# Patient Record
Sex: Male | Born: 1950 | Race: Black or African American | Hispanic: No | Marital: Single | Smoking: Former smoker
Health system: Southern US, Community
[De-identification: ages and names within clinical notes are randomized; demographics above are authoritative.]

## PROBLEM LIST (undated history)

## (undated) DIAGNOSIS — I639 Cerebral infarction, unspecified: Secondary | ICD-10-CM

## (undated) DIAGNOSIS — J449 Chronic obstructive pulmonary disease, unspecified: Secondary | ICD-10-CM

## (undated) DIAGNOSIS — I7090 Unspecified atherosclerosis: Secondary | ICD-10-CM

## (undated) DIAGNOSIS — G8190 Hemiplegia, unspecified affecting unspecified side: Secondary | ICD-10-CM

## (undated) DIAGNOSIS — J189 Pneumonia, unspecified organism: Secondary | ICD-10-CM

## (undated) HISTORY — PX: OTHER SURGICAL HISTORY: SHX169

## (undated) HISTORY — DX: Hemiplegia, unspecified affecting unspecified side: G81.90

## (undated) HISTORY — DX: Unspecified atherosclerosis: I70.90

## (undated) HISTORY — PX: BACK SURGERY: SHX140

## (undated) HISTORY — PX: INCISION AND DRAINAGE: SHX5863

## (undated) HISTORY — DX: Pneumonia, unspecified organism: J18.9

## (undated) HISTORY — DX: Chronic obstructive pulmonary disease, unspecified: J44.9

---

## 2004-02-06 ENCOUNTER — Encounter
Admission: RE | Admit: 2004-02-06 | Discharge: 2004-02-06 | Payer: Self-pay | Admitting: Physical Medicine and Rehabilitation

## 2004-03-25 ENCOUNTER — Ambulatory Visit (HOSPITAL_COMMUNITY): Admission: RE | Admit: 2004-03-25 | Discharge: 2004-03-25 | Payer: Self-pay | Admitting: Vascular Surgery

## 2004-04-12 ENCOUNTER — Inpatient Hospital Stay (HOSPITAL_COMMUNITY): Admission: RE | Admit: 2004-04-12 | Discharge: 2004-04-13 | Payer: Self-pay | Admitting: Vascular Surgery

## 2009-03-25 DIAGNOSIS — I639 Cerebral infarction, unspecified: Secondary | ICD-10-CM

## 2009-03-25 HISTORY — DX: Cerebral infarction, unspecified: I63.9

## 2012-08-30 ENCOUNTER — Encounter (HOSPITAL_COMMUNITY): Payer: Self-pay | Admitting: Emergency Medicine

## 2012-08-30 ENCOUNTER — Emergency Department (HOSPITAL_COMMUNITY)
Admission: EM | Admit: 2012-08-30 | Discharge: 2012-08-30 | Disposition: A | Discharge status: Home / Self Care | Payer: Self-pay | Attending: Emergency Medicine | Admitting: Emergency Medicine

## 2012-08-30 DIAGNOSIS — R609 Edema, unspecified: Secondary | ICD-10-CM | POA: Insufficient documentation

## 2012-08-30 DIAGNOSIS — R600 Localized edema: Secondary | ICD-10-CM

## 2012-08-30 LAB — CBC WITH DIFFERENTIAL/PLATELET
Basophils Absolute: 0 10*3/uL (ref 0.0–0.1)
Basophils Relative: 0 % (ref 0–1)
Eosinophils Absolute: 0.1 10*3/uL (ref 0.0–0.7)
Eosinophils Relative: 1 % (ref 0–5)
HCT: 38.3 % — ABNORMAL LOW (ref 39.0–52.0)
Hemoglobin: 13.2 g/dL (ref 13.0–17.0)
Lymphocytes Relative: 27 % (ref 12–46)
Lymphs Abs: 1.7 10*3/uL (ref 0.7–4.0)
MCH: 32 pg (ref 26.0–34.0)
MCHC: 34.5 g/dL (ref 30.0–36.0)
MCV: 93 fL (ref 78.0–100.0)
Monocytes Absolute: 0.5 10*3/uL (ref 0.1–1.0)
Monocytes Relative: 8 % (ref 3–12)
Neutro Abs: 3.9 10*3/uL (ref 1.7–7.7)
Neutrophils Relative %: 64 % (ref 43–77)
Platelets: 143 10*3/uL — ABNORMAL LOW (ref 150–400)
RBC: 4.12 MIL/uL — ABNORMAL LOW (ref 4.22–5.81)
RDW: 12.4 % (ref 11.5–15.5)
WBC: 6.1 10*3/uL (ref 4.0–10.5)

## 2012-08-30 LAB — COMPREHENSIVE METABOLIC PANEL
ALT: 12 U/L (ref 0–53)
AST: 24 U/L (ref 0–37)
Albumin: 3.9 g/dL (ref 3.5–5.2)
Alkaline Phosphatase: 151 U/L — ABNORMAL HIGH (ref 39–117)
BUN: 19 mg/dL (ref 6–23)
CO2: 27 mEq/L (ref 19–32)
Calcium: 10.5 mg/dL (ref 8.4–10.5)
Chloride: 100 mEq/L (ref 96–112)
Creatinine, Ser: 0.95 mg/dL (ref 0.50–1.35)
GFR calc Af Amer: >90 mL/min (ref >90)
GFR calc non Af Amer: 89 mL/min — ABNORMAL LOW (ref >90)
Glucose, Bld: 96 mg/dL (ref 70–99)
Potassium: 4.1 mEq/L (ref 3.5–5.1)
Sodium: 138 mEq/L (ref 135–145)
Total Bilirubin: 0.5 mg/dL (ref 0.3–1.2)
Total Protein: 7.9 g/dL (ref 6.0–8.3)

## 2012-08-30 MED ORDER — CEPHALEXIN 500 MG PO CAPS: 500.0000 mg | ORAL_CAPSULE | Freq: Four times a day (QID) | ORAL | Status: DC

## 2012-08-30 MED ORDER — ENOXAPARIN SODIUM 60 MG/0.6ML ~~LOC~~ SOLN
60.0000 mg | Freq: Once | SUBCUTANEOUS | Status: AC
  Administered 2012-08-30: 60 mg via SUBCUTANEOUS
  Filled 2012-08-30: qty 0.6

## 2012-08-30 MED ORDER — CEPHALEXIN 250 MG PO CAPS
500.0000 mg | ORAL_CAPSULE | Freq: Once | ORAL | Status: AC
  Administered 2012-08-30: 500 mg via ORAL
  Filled 2012-08-30: qty 2

## 2012-08-30 NOTE — ED Notes (Signed)
Pt states he has had pain in his lower leg for the past five months, pt states he has pain into his toes, but that it may be due to his ingrown toe nails. Pt states he has falling a few times in the past few months due to weakness.

## 2012-08-30 NOTE — ED Provider Notes (Signed)
History     CSN: [id number]  Arrival date & time 08/30/12  1724   First MD Initiated Contact with Patient 08/30/12 1850      Chief Complaint  Patient presents with  . Leg Pain    (Consider location/radiation/quality/duration/timing/severity/associated sxs/prior treatment) HPI  Benjamin [name] is a 62 y.o. male sent by his primary care physician left lower extremity edema, erythema and pain. He is to the last 5 months. There is a stent placed for bypass 2 unknown extremity several years ago and his primary care physician is concerned that there may be a clot. Patient is pain has been worsening over the last 5 months, he has no prior history of DVT or PE. He states that sometimes he stumbled secondary to the pain felt. He is not sure whether the pain is coming from the calf ankle or toe where he has multiple ingrown toenails. He denies any chest pain, shortness of breath, abdominal pain, nausea vomiting, fever, change in bowel or bladder habits.  History reviewed. No pertinent past medical history.  Past Surgical History  Procedure Laterality Date  . Other surgical history      Stent, unknown    No family history on file.  History  Substance Use Topics  . Smoking status: Not on file  . Smokeless tobacco: Not on file  . Alcohol Use: Not on file      Review of Systems  Constitutional: Negative for fever.  Respiratory: Negative for shortness of breath.   Cardiovascular: Negative for chest pain.  Gastrointestinal: Negative for nausea, vomiting, abdominal pain and diarrhea.  Musculoskeletal: Positive for arthralgias.  All other systems reviewed and are negative.    Allergies  Review of patient's allergies indicates no known allergies.  Home Medications  No current outpatient prescriptions on file.  BP 136/74  Pulse 66  Temp(Src) 97.9 F (36.6 C) (Oral)  Resp 18  SpO2 100%  Physical Exam  Nursing note and vitals reviewed. Constitutional: He is oriented to person,  place, and time. He appears well-developed and well-nourished. No distress.  HENT:  Head: Normocephalic and atraumatic.  Mouth/Throat: Oropharynx is clear and moist.  Eyes: Conjunctivae and EOM are normal. Pupils are equal, round, and reactive to light.  Neck: Normal range of motion.  Cardiovascular: Normal rate, regular rhythm and intact distal pulses.   Pulmonary/Chest: Effort normal and breath sounds normal. No stridor. No respiratory distress. He has no wheezes. He has no rales. He exhibits no tenderness.  Abdominal: Soft. Bowel sounds are normal. He exhibits no distension and no mass. There is no tenderness. There is no rebound and no guarding.  Musculoskeletal: Normal range of motion.  Neurological: He is alert and oriented to person, place, and time.  Skin:  1+ pitting edema to left lower extremity up to the distal one third of the shin. There is a warmth, erythema as well. There is no calf asymmetry, superficial collaterals were palpable cords. Homans sign is positive on the left side.  Psychiatric: He has a normal mood and affect.    ED Course  Procedures (including critical care time)  Labs Reviewed  CBC WITH DIFFERENTIAL - Abnormal; Notable for the following:    RBC 4.12 (*)    HCT 38.3 (*)    Platelets 143 (*)    All other components within normal limits  COMPREHENSIVE METABOLIC PANEL - Abnormal; Notable for the following:    Alkaline Phosphatase 151 (*)    GFR calc non Af Amer 89 (*)  All other components within normal limits   No results found.   1. Edema of left lower extremity       MDM   Benjamin [name] is a 62 y.o. male left lower extremity cellulitis versus DVT. Venous duplex was ordered but cannot be completed as ultrasound is no longer in house. I will give him Lovenox and advised him to return in the a.m. to the vascular lab for completion of imaging. I have explained to him that I will also treat him for a cellulitis that he is to continue taking the  antibiotics if the imaging comes up negative for clot. He is instructed to return to the emergency room immediately for any shortness of breath, chest pain, hemoptysis.   Filed Vitals:   08/30/12 1749 08/30/12 1945 08/30/12 2015 08/30/12 2022  BP: 143/88 136/74 157/93   Pulse: 81 66 98   Temp: 97.9 F (36.6 C)     TempSrc: Oral     Resp: 18     Weight:    140 lb (63.504 kg)  SpO2: 95% 100% 94%      VSS and patient is appropriate for, and amenable to, discharge at this time. Pt verbalized understanding and agrees with care plan. Outpatient follow-up and return precautions given.    Discharge Medication List as of 08/30/2012  8:24 PM    START taking these medications   Details  cephALEXin (KEFLEX) 500 MG capsule Take 1 capsule (500 mg total) by mouth 4 (four) times daily., Starting 08/30/2012, Until Discontinued, [employer], PA-C 08/31/12 [phone]

## 2012-08-30 NOTE — ED Notes (Signed)
Pt reporting had a stent placed in his upper leg some time ago. Stent was to increase blood flow. Called PCP told to come here, concerned for blood clot. Pt reports left calf pain  Since early this week. Reports his feet are peeling more than normal.

## 2012-08-31 ENCOUNTER — Ambulatory Visit (HOSPITAL_COMMUNITY)
Admission: RE | Admit: 2012-08-31 | Discharge: 2012-08-31 | Disposition: A | Discharge status: Home / Self Care | Payer: Self-pay | Source: Ambulatory Visit | Attending: Emergency Medicine | Admitting: Emergency Medicine

## 2012-08-31 DIAGNOSIS — M7989 Other specified soft tissue disorders: Secondary | ICD-10-CM

## 2012-08-31 NOTE — Progress Notes (Signed)
VASCULAR LAB PRELIMINARY  PRELIMINARY  PRELIMINARY  PRELIMINARY  Left lower extremity venous duplex completed.    Preliminary report:  Left:  No evidence of DVT, superficial thrombosis, or Baker's cyst.  Zondra Lawlor, RVT 08/31/2012, 10:03 AM

## 2012-08-31 NOTE — ED Provider Notes (Signed)
  Medical screening examination/treatment/procedure(s) were performed by non-physician practitioner and as supervising physician I was immediately available for consultation/collaboration.    Gerhard Munch, MD 08/31/12 2248

## 2013-02-11 ENCOUNTER — Encounter (HOSPITAL_BASED_OUTPATIENT_CLINIC_OR_DEPARTMENT_OTHER): Payer: Medicaid Other | Attending: Plastic Surgery

## 2013-02-11 DIAGNOSIS — I890 Lymphedema, not elsewhere classified: Secondary | ICD-10-CM | POA: Insufficient documentation

## 2013-02-11 DIAGNOSIS — M7989 Other specified soft tissue disorders: Secondary | ICD-10-CM | POA: Insufficient documentation

## 2013-02-12 NOTE — Progress Notes (Signed)
[facility]  NAME:  Benjamin [name], Benjamin [name]                ACCOUNT NO.:  [id number]  MEDICAL RECORD NO.:  [id number]      DATE OF BIRTH:  23-Jan-1951  PHYSICIAN:  Wayland Denis, DO       VISIT DATE:  02/11/2013                                  OFFICE VISIT   The patient is a 62 year old gentleman who is here for evaluation of his left lower extremity.  He has a history of lymphedema.  He did go to the vascular lab and there was no evidence of deep vein thrombosis, and he did have compressibility of his veins.  He has not been elevating his legs and just using a pair of socks.  He does not have any allergies. He is on a fluid pill, he is not sure what kind and vitamin D.  PAST MEDICAL HISTORY:  Positive for possible cerebrovascular accident, hypertension, depression, peripheral vascular disease, and vitamin D deficiency, he has had a left leg peripheral stent.  REVIEW OF SYSTEMS:  Otherwise negative.  SOCIAL HISTORY:  He lives at home.  PHYSICAL EXAMINATION:  GENERAL:  On exam, he is alert, oriented, cooperative, not in any acute distress.  He does have family with him. EYES:  His pupils are equal. NECK:  He does not have any cervical lymphadenopathy. LUNGS:  His breathing is unlabored. ABDOMEN:  Soft and nontender.  He is not obese.  His pulse is regular. EXTREMITIES:  He does have some swelling and lymphedema in the bilateral lower extremities.  No real open wounds, just some scabbed areas.  His pulses are strong.  He does have varicose veins.  Recommend elevation, multivitamin, vitamin C, zinc, protein, check pre- albumin, and Unna boot.  I have expressed the importance of elevation to him, and we will see him back in followup.     Wayland Denis, DO     CS/MEDQ  D:  02/11/2013  T:  02/12/2013  Job:  [id number]

## 2013-02-16 ENCOUNTER — Emergency Department (HOSPITAL_COMMUNITY): Payer: Medicaid Other

## 2013-02-16 ENCOUNTER — Inpatient Hospital Stay (HOSPITAL_COMMUNITY)
Admission: EM | Admit: 2013-02-16 | Discharge: 2013-02-20 | DRG: 065 | Disposition: A | Discharge status: Skilled Nursing Facility | Payer: Medicaid Other | Attending: Internal Medicine | Admitting: Internal Medicine

## 2013-02-16 ENCOUNTER — Encounter (HOSPITAL_COMMUNITY): Payer: Self-pay | Admitting: Emergency Medicine

## 2013-02-16 DIAGNOSIS — R531 Weakness: Secondary | ICD-10-CM | POA: Diagnosis present

## 2013-02-16 DIAGNOSIS — Y92009 Unspecified place in unspecified non-institutional (private) residence as the place of occurrence of the external cause: Secondary | ICD-10-CM

## 2013-02-16 DIAGNOSIS — N390 Urinary tract infection, site not specified: Secondary | ICD-10-CM | POA: Diagnosis present

## 2013-02-16 DIAGNOSIS — I6350 Cerebral infarction due to unspecified occlusion or stenosis of unspecified cerebral artery: Principal | ICD-10-CM | POA: Diagnosis present

## 2013-02-16 DIAGNOSIS — L723 Sebaceous cyst: Secondary | ICD-10-CM | POA: Diagnosis present

## 2013-02-16 DIAGNOSIS — I10 Essential (primary) hypertension: Secondary | ICD-10-CM | POA: Diagnosis present

## 2013-02-16 DIAGNOSIS — W19XXXA Unspecified fall, initial encounter: Secondary | ICD-10-CM | POA: Clinically undetermined

## 2013-02-16 DIAGNOSIS — I639 Cerebral infarction, unspecified: Secondary | ICD-10-CM

## 2013-02-16 DIAGNOSIS — Z8673 Personal history of transient ischemic attack (TIA), and cerebral infarction without residual deficits: Secondary | ICD-10-CM

## 2013-02-16 DIAGNOSIS — D72829 Elevated white blood cell count, unspecified: Secondary | ICD-10-CM | POA: Diagnosis present

## 2013-02-16 DIAGNOSIS — E785 Hyperlipidemia, unspecified: Secondary | ICD-10-CM | POA: Diagnosis present

## 2013-02-16 DIAGNOSIS — L02219 Cutaneous abscess of trunk, unspecified: Secondary | ICD-10-CM | POA: Diagnosis present

## 2013-02-16 DIAGNOSIS — F0390 Unspecified dementia without behavioral disturbance: Secondary | ICD-10-CM | POA: Diagnosis present

## 2013-02-16 DIAGNOSIS — IMO0002 Reserved for concepts with insufficient information to code with codable children: Secondary | ICD-10-CM | POA: Diagnosis present

## 2013-02-16 DIAGNOSIS — R627 Adult failure to thrive: Secondary | ICD-10-CM | POA: Diagnosis present

## 2013-02-16 DIAGNOSIS — E876 Hypokalemia: Secondary | ICD-10-CM | POA: Diagnosis present

## 2013-02-16 DIAGNOSIS — Z23 Encounter for immunization: Secondary | ICD-10-CM

## 2013-02-16 DIAGNOSIS — R29898 Other symptoms and signs involving the musculoskeletal system: Secondary | ICD-10-CM | POA: Diagnosis present

## 2013-02-16 DIAGNOSIS — E538 Deficiency of other specified B group vitamins: Secondary | ICD-10-CM | POA: Diagnosis present

## 2013-02-16 DIAGNOSIS — R5381 Other malaise: Secondary | ICD-10-CM

## 2013-02-16 HISTORY — DX: Cerebral infarction, unspecified: I63.9

## 2013-02-16 LAB — URINALYSIS, ROUTINE W REFLEX MICROSCOPIC
Nitrite: NEGATIVE
Protein, ur: >300 mg/dL — AB
Specific Gravity, Urine: 1.031 — ABNORMAL HIGH (ref 1.005–1.030)
Urobilinogen, UA: 1 mg/dL (ref 0.0–1.0)

## 2013-02-16 LAB — DIFFERENTIAL
Basophils Absolute: 0 10*3/uL (ref 0.0–0.1)
Basophils Relative: 0 % (ref 0–1)
Eosinophils Absolute: 0 10*3/uL (ref 0.0–0.7)
Eosinophils Relative: 0 % (ref 0–5)
Lymphocytes Relative: 4 % — ABNORMAL LOW (ref 12–46)
Lymphs Abs: 0.8 10*3/uL (ref 0.7–4.0)
Monocytes Absolute: 1.3 10*3/uL — ABNORMAL HIGH (ref 0.1–1.0)
Monocytes Relative: 6 % (ref 3–12)
Neutro Abs: 19.9 10*3/uL — ABNORMAL HIGH (ref 1.7–7.7)
Neutrophils Relative %: 91 % — ABNORMAL HIGH (ref 43–77)

## 2013-02-16 LAB — POCT I-STAT, CHEM 8
Chloride: 103 mEq/L (ref 96–112)
Creatinine, Ser: 1.2 mg/dL (ref 0.50–1.35)
HCT: 40 % (ref 39.0–52.0)
Hemoglobin: 13.6 g/dL (ref 13.0–17.0)
Potassium: 3.8 mEq/L (ref 3.5–5.1)
Sodium: 141 mEq/L (ref 135–145)
TCO2: 22 mmol/L (ref 0–100)

## 2013-02-16 LAB — CBC
HCT: 36.3 % — ABNORMAL LOW (ref 39.0–52.0)
Hemoglobin: 12.3 g/dL — ABNORMAL LOW (ref 13.0–17.0)
MCH: 30.3 pg (ref 26.0–34.0)
MCHC: 33.9 g/dL (ref 30.0–36.0)
MCV: 89.4 fL (ref 78.0–100.0)
Platelets: 148 10*3/uL — ABNORMAL LOW (ref 150–400)
RBC: 4.06 MIL/uL — ABNORMAL LOW (ref 4.22–5.81)
RDW: 12.6 % (ref 11.5–15.5)
WBC: 22 10*3/uL — ABNORMAL HIGH (ref 4.0–10.5)

## 2013-02-16 LAB — RAPID URINE DRUG SCREEN, HOSP PERFORMED
Amphetamines: NOT DETECTED
Benzodiazepines: NOT DETECTED
Cocaine: NOT DETECTED
Opiates: NOT DETECTED
Tetrahydrocannabinol: NOT DETECTED

## 2013-02-16 LAB — COMPREHENSIVE METABOLIC PANEL
ALT: 74 U/L — ABNORMAL HIGH (ref 0–53)
AST: 226 U/L — ABNORMAL HIGH (ref 0–37)
Albumin: 2.9 g/dL — ABNORMAL LOW (ref 3.5–5.2)
Alkaline Phosphatase: 171 U/L — ABNORMAL HIGH (ref 39–117)
BUN: 30 mg/dL — ABNORMAL HIGH (ref 6–23)
CO2: 25 mEq/L (ref 19–32)
Calcium: 9.2 mg/dL (ref 8.4–10.5)
Chloride: 98 mEq/L (ref 96–112)
Creatinine, Ser: 0.96 mg/dL (ref 0.50–1.35)
GFR calc Af Amer: >90 mL/min (ref >90)
GFR calc non Af Amer: 88 mL/min — ABNORMAL LOW (ref >90)
Glucose, Bld: 93 mg/dL (ref 70–99)
Potassium: 3.8 mEq/L (ref 3.5–5.1)
Sodium: 136 mEq/L (ref 135–145)
Total Bilirubin: 0.9 mg/dL (ref 0.3–1.2)
Total Protein: 7.2 g/dL (ref 6.0–8.3)

## 2013-02-16 LAB — URINE MICROSCOPIC-ADD ON

## 2013-02-16 LAB — TROPONIN I: Troponin I: <0.3 ng/mL (ref <0.30)

## 2013-02-16 LAB — PROTIME-INR
INR: 1.12 (ref 0.00–1.49)
Prothrombin Time: 14.2 seconds (ref 11.6–15.2)

## 2013-02-16 LAB — POCT I-STAT TROPONIN I: Troponin i, poc: 0.02 ng/mL (ref 0.00–0.08)

## 2013-02-16 LAB — APTT: aPTT: 29 seconds (ref 24–37)

## 2013-02-16 LAB — GLUCOSE, CAPILLARY: Glucose-Capillary: 92 mg/dL (ref 70–99)

## 2013-02-16 LAB — ETHANOL: Alcohol, Ethyl (B): <11 mg/dL (ref 0–11)

## 2013-02-16 MED ORDER — ASPIRIN 325 MG PO TABS
325.0000 mg | ORAL_TABLET | Freq: Every day | ORAL | Status: DC
  Administered 2013-02-17 – 2013-02-20 (×5): 325 mg via ORAL
  Filled 2013-02-16 (×5): qty 1

## 2013-02-16 NOTE — ED Provider Notes (Signed)
Medical screening examination/treatment/procedure(s) were performed by non-physician practitioner and as supervising physician I was immediately available for consultation/collaboration.     Gwyneth Sprout, MD 02/16/13 2240

## 2013-02-16 NOTE — ED Notes (Signed)
Sister is at the bedside stated that pt has been weak on lt side and that she thinks he had a stroke many months ago but he refuse to see a MD because he had no insurance she denies any other deceit only lt side weakness. Pt was seen at wound clinic for lt leg blister on Monday

## 2013-02-16 NOTE — ED Provider Notes (Signed)
CSN: [id number]     Arrival date & time 02/16/13  1936 History   First MD Initiated Contact with Patient 02/16/13 1947     Chief Complaint  Patient presents with  . Weakness   (Consider location/radiation/quality/duration/timing/severity/associated sxs/prior Treatment) HPI Comments: Patient presents to the ED with a chief complaint of left leg weakness.  He states that the symptoms started 2 hours ago.  He was watching a football game, and went to stand up, but could not move his left leg.  He has a history of stroke in 2010, and still has pressured speech from this.  He is able to move his leg in the ED.  Additionally, he was recently seen by wound care for a blister on his leg.  The blister seems to have healed well.  He denies chest pain, SOB, n/v/d.  He has had some cough, and smells of urine.  The history is provided by the patient. No language interpreter was used.    Past Medical History  Diagnosis Date  . Stroke    Past Surgical History  Procedure Laterality Date  . Other surgical history      Stent, unknown   No family history on file. History  Substance Use Topics  . Smoking status: Not on file  . Smokeless tobacco: Not on file  . Alcohol Use: No    Review of Systems  All other systems reviewed and are negative.    Allergies  Review of patient's allergies indicates no known allergies.  Home Medications   Current Outpatient Rx  Name  Route  Sig  Dispense  Refill  . cephALEXin (KEFLEX) 500 MG capsule   Oral   Take 1 capsule (500 mg total) by mouth 4 (four) times daily.   20 capsule   0    BP 147/79  Pulse 73  Temp(Src) 98.1 F (36.7 C) (Oral)  Resp 16  SpO2 97% Physical Exam  Nursing note and vitals reviewed. Constitutional: He is oriented to person, place, and time. He appears well-developed and well-nourished.  HENT:  Head: Normocephalic and atraumatic.  Nose: Nose normal.  Mouth/Throat: Oropharynx is clear and moist. No oropharyngeal exudate.   Abrasion on left forehead  Eyes: Conjunctivae and EOM are normal. Pupils are equal, round, and reactive to light. Right eye exhibits no discharge. Left eye exhibits no discharge. No scleral icterus.  Neck: Normal range of motion. Neck supple. No JVD present.  Cardiovascular: Normal rate, regular rhythm, normal heart sounds and intact distal pulses.  Exam reveals no gallop and no friction rub.   No murmur heard. Pulmonary/Chest: Effort normal and breath sounds normal. No respiratory distress. He has no wheezes. He has no rales. He exhibits no tenderness.  Abdominal: Soft. Bowel sounds are normal. He exhibits no distension and no mass. There is no tenderness. There is no rebound and no guarding.  Musculoskeletal: Normal range of motion. He exhibits no edema and no tenderness.  Neurological: He is alert and oriented to person, place, and time. He has normal reflexes.  CN 3-12 intact  Skin: Skin is warm and dry.  Nodule on back between shoulder blades, non-tender, no evidence of infection   Psychiatric: He has a normal mood and affect. His behavior is normal. Judgment and thought content normal.    ED Course  Procedures (including critical care time) Labs Review Labs Reviewed  ETHANOL  PROTIME-INR  APTT  CBC  DIFFERENTIAL  COMPREHENSIVE METABOLIC PANEL  TROPONIN I  URINE RAPID DRUG SCREEN (HOSP  PERFORMED)  URINALYSIS, ROUTINE W REFLEX MICROSCOPIC   Results for orders placed during the hospital encounter of 02/16/13  ETHANOL      Result Value Range   Alcohol, Ethyl (B) <11  0 - 11 mg/dL  PROTIME-INR      Result Value Range   Prothrombin Time 14.2  11.6 - 15.2 seconds   INR 1.12  0.00 - 1.49  APTT      Result Value Range   aPTT 29  24 - 37 seconds  CBC      Result Value Range   WBC 22.0 (*) 4.0 - 10.5 K/uL   RBC 4.06 (*) 4.22 - 5.81 MIL/uL   Hemoglobin 12.3 (*) 13.0 - 17.0 g/dL   HCT 47.8 (*) 29.5 - 62.1 %   MCV 89.4  78.0 - 100.0 fL   MCH 30.3  26.0 - 34.0 pg   MCHC 33.9   30.0 - 36.0 g/dL   RDW 30.8  65.7 - 84.6 %   Platelets 148 (*) 150 - 400 K/uL  DIFFERENTIAL      Result Value Range   Neutrophils Relative % 91 (*) 43 - 77 %   Neutro Abs 19.9 (*) 1.7 - 7.7 K/uL   Lymphocytes Relative 4 (*) 12 - 46 %   Lymphs Abs 0.8  0.7 - 4.0 K/uL   Monocytes Relative 6  3 - 12 %   Monocytes Absolute 1.3 (*) 0.1 - 1.0 K/uL   Eosinophils Relative 0  0 - 5 %   Eosinophils Absolute 0.0  0.0 - 0.7 K/uL   Basophils Relative 0  0 - 1 %   Basophils Absolute 0.0  0.0 - 0.1 K/uL  COMPREHENSIVE METABOLIC PANEL      Result Value Range   Sodium 136  135 - 145 mEq/L   Potassium 3.8  3.5 - 5.1 mEq/L   Chloride 98  96 - 112 mEq/L   CO2 25  19 - 32 mEq/L   Glucose, Bld 93  70 - 99 mg/dL   BUN 30 (*) 6 - 23 mg/dL   Creatinine, Ser 9.62  0.50 - 1.35 mg/dL   Calcium 9.2  8.4 - 95.2 mg/dL   Total Protein 7.2  6.0 - 8.3 g/dL   Albumin 2.9 (*) 3.5 - 5.2 g/dL   AST 841 (*) 0 - 37 U/L   ALT 74 (*) 0 - 53 U/L   Alkaline Phosphatase 171 (*) 39 - 117 U/L   Total Bilirubin 0.9  0.3 - 1.2 mg/dL   GFR calc non Af Amer 88 (*) >90 mL/min   GFR calc Af Amer >90  >90 mL/min  TROPONIN I      Result Value Range   Troponin I <0.30  <0.30 ng/mL  GLUCOSE, CAPILLARY      Result Value Range   Glucose-Capillary 92  70 - 99 mg/dL  POCT I-STAT, CHEM 8      Result Value Range   Sodium 141  135 - 145 mEq/L   Potassium 3.8  3.5 - 5.1 mEq/L   Chloride 103  96 - 112 mEq/L   BUN 30 (*) 6 - 23 mg/dL   Creatinine, Ser 3.24  0.50 - 1.35 mg/dL   Glucose, Bld 92  70 - 99 mg/dL   Calcium, Ion 4.01  0.27 - 1.30 mmol/L   TCO2 22  0 - 100 mmol/L   Hemoglobin 13.6  13.0 - 17.0 g/dL   HCT 25.3  66.4 - 40.3 %  POCT I-STAT TROPONIN I      Result Value Range   Troponin i, poc 0.02  0.00 - 0.08 ng/mL   Comment 3            Dg Chest 2 View  02/16/2013   CLINICAL DATA:  Left-sided weakness.  EXAM: CHEST  2 VIEW  COMPARISON:  None.  FINDINGS: Cardiopericardial silhouette within normal limits. Mediastinal  contours normal. Trachea midline. No airspace disease or effusion. Monitoring leads project over the chest.  IMPRESSION: No active cardiopulmonary disease.   Electronically Signed   By: Andreas Newport M.D.   On: 02/16/2013 21:46   Ct Head Wo Contrast  02/16/2013   CLINICAL DATA:  Weakness. History of stroke.  EXAM: CT HEAD WITHOUT CONTRAST  TECHNIQUE: Contiguous axial images were obtained from the base of the skull through the vertex without intravenous contrast.  COMPARISON:  None.  FINDINGS: No mass lesion, mass effect, midline shift, hydrocephalus, hemorrhage. No acute territorial cortical ischemia/infarct. Atrophy and chronic ischemic white matter disease is present. Small lacunar infarct is present in the anterior limb of the right internal capsule, likely chronic. Calvarium intact. Left maxillary mucosal thickening. Question prior left maxillary antrectomy. Intracranial atherosclerosis.  IMPRESSION: Atrophy and chronic ischemic white matter disease without acute intracranial abnormality.   Electronically Signed   By: Andreas Newport M.D.   On: 02/16/2013 20:45     Imaging Review No results found.  EKG Interpretation     Ventricular Rate:  76 PR Interval:  158 QRS Duration: 122 QT Interval:  434 QTC Calculation: 488 R Axis:   108 Text Interpretation:  Normal sinus rhythm Right bundle branch block Artifact            MDM   1. Weakness     Patient with stroke history.  Reported weakness 2 hours ago.  Now no neurologic deficits.  Patient discussed with Dr. Anitra Lauth, who recommends admission for TIA workup.  WBC is elevated to 22. Will check urine and CXR.       Roxy Horseman, PA-C 02/16/13 2221
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
## 2013-02-16 NOTE — ED Notes (Signed)
Patient transported to CT 
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
## 2013-02-16 NOTE — ED Notes (Signed)
Bed: ZO10 Expected date: 02/16/13 Expected time: 7:26 PM Means of arrival: Ambulance Comments: orthostatic

## 2013-02-17 ENCOUNTER — Inpatient Hospital Stay (HOSPITAL_COMMUNITY): Payer: Medicaid Other

## 2013-02-17 ENCOUNTER — Encounter (HOSPITAL_COMMUNITY): Payer: Self-pay | Admitting: *Deleted

## 2013-02-17 DIAGNOSIS — I6350 Cerebral infarction due to unspecified occlusion or stenosis of unspecified cerebral artery: Principal | ICD-10-CM

## 2013-02-17 DIAGNOSIS — I369 Nonrheumatic tricuspid valve disorder, unspecified: Secondary | ICD-10-CM

## 2013-02-17 DIAGNOSIS — I639 Cerebral infarction, unspecified: Secondary | ICD-10-CM | POA: Diagnosis present

## 2013-02-17 LAB — CBC
HCT: 33.3 % — ABNORMAL LOW (ref 39.0–52.0)
Hemoglobin: 11.3 g/dL — ABNORMAL LOW (ref 13.0–17.0)
MCH: 30.7 pg (ref 26.0–34.0)
MCHC: 33.9 g/dL (ref 30.0–36.0)
MCV: 90.5 fL (ref 78.0–100.0)
Platelets: 147 10*3/uL — ABNORMAL LOW (ref 150–400)
RDW: 12.8 % (ref 11.5–15.5)
WBC: 17.8 10*3/uL — ABNORMAL HIGH (ref 4.0–10.5)

## 2013-02-17 LAB — LIPID PANEL
Cholesterol: 175 mg/dL (ref 0–200)
Triglycerides: 107 mg/dL (ref <150)
VLDL: 21 mg/dL (ref 0–40)

## 2013-02-17 LAB — HEPATIC FUNCTION PANEL
AST: 208 U/L — ABNORMAL HIGH (ref 0–37)
Alkaline Phosphatase: 145 U/L — ABNORMAL HIGH (ref 39–117)
Bilirubin, Direct: 0.2 mg/dL (ref 0.0–0.3)
Indirect Bilirubin: 0.6 mg/dL (ref 0.3–0.9)
Total Bilirubin: 0.8 mg/dL (ref 0.3–1.2)

## 2013-02-17 LAB — HEMOGLOBIN A1C
Hgb A1c MFr Bld: 5.5 %
Mean Plasma Glucose: 111 mg/dL

## 2013-02-17 LAB — HEPATITIS PANEL, ACUTE
HCV Ab: NEGATIVE
Hep A IgM: NONREACTIVE

## 2013-02-17 LAB — CREATININE, SERUM
GFR calc Af Amer: >90 mL/min (ref >90)
GFR calc non Af Amer: >90 mL/min (ref >90)

## 2013-02-17 LAB — GLUCOSE, CAPILLARY: Glucose-Capillary: 127 mg/dL — ABNORMAL HIGH (ref 70–99)

## 2013-02-17 LAB — HIV ANTIBODY (ROUTINE TESTING W REFLEX): HIV: NONREACTIVE

## 2013-02-17 LAB — RPR: RPR Ser Ql: NONREACTIVE

## 2013-02-17 MED ORDER — SODIUM CHLORIDE 0.9 % IV SOLN
INTRAVENOUS | Status: DC
  Administered 2013-02-17: 1000 mL via INTRAVENOUS
  Administered 2013-02-18: 07:00:00 via INTRAVENOUS

## 2013-02-17 MED ORDER — DEXTROSE 5 % IV SOLN
1.0000 g | INTRAVENOUS | Status: DC
  Administered 2013-02-17 – 2013-02-19 (×3): 1 g via INTRAVENOUS
  Filled 2013-02-17 (×3): qty 10

## 2013-02-17 MED ORDER — IOHEXOL 300 MG/ML  SOLN
100.0000 mL | Freq: Once | INTRAMUSCULAR | Status: AC | PRN
  Administered 2013-02-17: 100 mL via INTRAVENOUS

## 2013-02-17 MED ORDER — HEPARIN SODIUM (PORCINE) 5000 UNIT/ML IJ SOLN
5000.0000 [IU] | Freq: Three times a day (TID) | INTRAMUSCULAR | Status: DC
  Administered 2013-02-17 – 2013-02-19 (×7): 5000 [IU] via SUBCUTANEOUS
  Filled 2013-02-17 (×10): qty 1

## 2013-02-17 NOTE — Progress Notes (Addendum)
Patient ID: Benjamin [name]  male  ZOX:[id number]    DOB: [birth year]    DOA: 02/16/2013  PCP: No PCP Per Patient  Addendum:  MRI results reviewed MRI HEAD IMPRESSION  1. Acute nonhemorrhagic infarct involving the right caudate head  measures 14 mm maximally.  2. Punctate diffusion abnormality in the posterior left frontal lobe  may represent a resolving white matter infarct.  3. Diffuse atrophy and extensive white matter disease, suggesting  advanced microvascular ischemic changes.  4. Multiple bilateral remote lacunar infarcts of the basal ganglia  and cerebellum bilaterally.  MRA HEAD IMPRESSION  1. Mild distal small vessel disease.  2. No significant proximal stenosis, aneurysm, or branch vessel  occlusion.  - Neurology consult called, d/w Dr Marland Kitchen M.D. [facility] 02/17/2013, 2:53 PM  Pager: [phone]     Assessment/Plan: Principal Problem: Left-sided weakness superimposed on generalized debility: The patient is a poor historian, states that left sided weakness for last 1 week, has been using a walker for few months, per admitting H&P, sudden onset of aphasia in December 2014 with trouble walking but did not seek medical care. Yesterday was not able to walk and was crawling in the house and his head was scraped on the rug - CVA workup in progress, pending MRI/MRA brain. CT head showed atrophic in chronic ischemic white matter disease without acute intracranial abnormality. Small lacunar infarct in the anterior limb of the right internal capsule likely chronic. - 2-D echo, carotid Dopplers ordered - PT OT evaluation pending - Lipid panel shows cholesterol 145, LDL 103. Will not place on statin due to acute transaminitis  - Hemoglobin A1c pending  -Continue aspirin 325 mg daily - RN swallow screen, start on heart healthy diet    Active Problems: Transaminitis: States that he has quit drinking alcohol - RUQ US shows 2.3 cm x2.6 cm echogenic area in  left lobe of the liver, obtain contrast CT abdomen to rule out any hemangioma versus mass - HIV  hepatitis panel pending    Leukocytosis: Likely due to UTI Urine culture pending, started on Rocephin IV     Failure to thrive in adult, appears to have some dementia as well - Will likely need a skilled nursing facility, await PT evaluation  - B12, folate, RPR, HIV pending   abrasions of the head and forehead  -wound care consult  DVT Prophylaxis: heparin subcutaneous   Code Status:  Disposition:    Subjective: Poor Historian, denies any specific complaints, states left sided weakness his one week ago, however had sudden aphasia and difficulty ambulating since December 2013. Denies any coughing or choking.   Objective: Weight change:  No intake or output data in the 24 hours ending 02/17/13 1020 Blood pressure 158/90, pulse 86, temperature 98.4 F (36.9 C), temperature source Oral, resp. rate 18, height 5\' 9"  (1.753 m), weight 66.3 kg (146 lb 2.6 oz), SpO2 96.00%.  Physical Exam: General: Alert and awake, oriented x3, not in any acute distress. Bandage on head, abrasions on forehead CVS: S1-S2 clear, no murmur rubs or gallops Chest: clear to auscultation bilaterally, no wheezing, rales or rhonchi Abdomen: soft nontender, nondistended, normal bowel sounds  Extremities: no cyanosis, clubbing or edema noted bilaterally Neuro: Cranial nerves II-XII intact, LUE and LLE 4/5, RUE and RLE 5/5  Lab Results: Basic Metabolic Panel:  Recent Labs Lab 02/16/13 2022 02/16/13 2101 02/17/13 0440  NA 136 141  --   K 3.8 3.8  --   CL 98  103  --   CO2 25  --   --   GLUCOSE 93 92  --   BUN 30* 30*  --   CREATININE 0.96 1.20 0.88  CALCIUM 9.2  --   --    Liver Function Tests:  Recent Labs Lab 02/16/13 2022 02/17/13 0440  AST 226* 208*  ALT 74* 69*  ALKPHOS 171* 145*  BILITOT 0.9 0.8  PROT 7.2 6.9  ALBUMIN 2.9* 2.8*   No results found for this basename: LIPASE, AMYLASE,  in  the last 168 hours No results found for this basename: AMMONIA,  in the last 168 hours CBC:  Recent Labs Lab 02/16/13 2022 02/16/13 2101 02/17/13 0440  WBC 22.0*  --  17.8*  NEUTROABS 19.9*  --   --   HGB 12.3* 13.6 11.3*  HCT 36.3* 40.0 33.3*  MCV 89.4  --  90.5  PLT 148*  --  147*   Cardiac Enzymes:  Recent Labs Lab 02/16/13 2022  TROPONINI <0.30   BNP: No components found with this basename: POCBNP,  CBG:  Recent Labs Lab 02/16/13 2103 02/17/13 0753  GLUCAP 92 87     Micro Results: No results found for this or any previous visit (from the past 240 hour(s)).  Studies/Results: Dg Chest 2 View  02/16/2013   CLINICAL DATA:  Left-sided weakness.  EXAM: CHEST  2 VIEW  COMPARISON:  None.  FINDINGS: Cardiopericardial silhouette within normal limits. Mediastinal contours normal. Trachea midline. No airspace disease or effusion. Monitoring leads project over the chest.  IMPRESSION: No active cardiopulmonary disease.   Electronically Signed   By: Andreas Newport M.D.   On: 02/16/2013 21:46   Ct Head Wo Contrast  02/16/2013   CLINICAL DATA:  Weakness. History of stroke.  EXAM: CT HEAD WITHOUT CONTRAST  TECHNIQUE: Contiguous axial images were obtained from the base of the skull through the vertex without intravenous contrast.  COMPARISON:  None.  FINDINGS: No mass lesion, mass effect, midline shift, hydrocephalus, hemorrhage. No acute territorial cortical ischemia/infarct. Atrophy and chronic ischemic white matter disease is present. Small lacunar infarct is present in the anterior limb of the right internal capsule, likely chronic. Calvarium intact. Left maxillary mucosal thickening. Question prior left maxillary antrectomy. Intracranial atherosclerosis.  IMPRESSION: Atrophy and chronic ischemic white matter disease without acute intracranial abnormality.   Electronically Signed   By: Andreas Newport M.D.   On: 02/16/2013 20:45   US Abdomen Complete  02/17/2013   CLINICAL  DATA:  Elevated LFTs  EXAM: ULTRASOUND ABDOMEN COMPLETE  COMPARISON:  None.  FINDINGS: Gallbladder  Gallbladder sludge is noted. No shadowing gallstones. No thickening of gallbladder wall. No sonographic Murphy's sign.  Common bile duct  Diameter: There is CBD dilatation measuring 11 mm in diameter.  Liver  There liver is heterogeneous. There is echogenic area in left hepatic lobe measures 2.6 x 2.3 cm. Statistically this may represent a hemangioma. Confirmation with enhanced CT or MRI could be performed. No intrahepatic biliary ductal dilatation.  IVC  No abnormality visualized.  Pancreas  No focal pancreatic abnormality. Main pancreatic duct measures 2.9 mm in diameter.  Spleen  Size and appearance within normal limits.  Measures 6 cm in length.  Right Kidney  Length: 10.9 cm. Echogenicity within normal limits. No mass or hydronephrosis visualized.  Left Kidney  Length: 10 cm. Echogenicity within normal limits. No mass or hydronephrosis visualized.  Abdominal aorta  No aneurysm visualized.  Measures up to 2.5 cm in diameter.  IMPRESSION: 1.  No shadowing gallstones are noted within gallbladder. Gallbladder sludge is noted. 2. CBD dilatation measuring 1.1 cm in diameter. 3. Heterogeneous liver without biliary ductal dilatation. Question hemangioma in left hepatic lobe measures 2.6 x 2.3 cm. Further evaluation with enhanced CT or MRI could be performed. 4. No hydronephrosis or hydroureter.   Electronically Signed   By: Natasha Mead M.D.   On: 02/17/2013 09:46    Medications: Scheduled Meds: . aspirin  325 mg Oral Daily  . cefTRIAXone (ROCEPHIN)  IV  1 g Intravenous Q24H  . heparin  5,000 Units Subcutaneous Q8H      LOS: 1 day   RAI,RIPUDEEP M.D. [facility] 02/17/2013, 10:20 AM Pager: [phone]  If 7PM-7AM, please contact night-coverage [email or web address] Password [email or web address]

## 2013-02-17 NOTE — Consult Note (Signed)
Referring Physician: Dr Isidoro Donning    Chief Complaint: Stroke  HPI: Benjamin Benjamin [name] is an 62 y.o. male admitted to [facility] yesterday after his sister found him on the floor of his home unable to get up. 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 The patient's sister is in the room today and she gives most of the history. Apparently in 2010 she noted that the patient was slurring his speech. This has occurred intermittently since that time. The patient has also complained of intermittent numbness of his upper extremities at different times. Neurology has been asked to Benjamin [name] the patient and to make recommendations regarding treatment.  Date last known well: Date: 02/14/2013 Time last known well: Unable to determine tPA Given: No: The patient was outside the window for treatment.  Past Medical History  Diagnosis Date  . Stroke     Past Surgical History  Procedure Laterality Date  . Other surgical history      Stent, unknown    Family history: The patient's father died in his 73s following a motor vehicle accident. His mother is living at age 58. She has diabetes, coronary artery disease, thyroid problems, and elevated cholesterol.  Social History:  He quit smoking in June. He had been smoking 1 pack cigarettes per day for approximately 40 years. He also quit drinking alcohol in April of this year. He usually drank approximately two 6 packs of beer per week. He denies drug use. He has never been married. He lives alone. He has never used smokeless tobacco.   Allergies: No Known Allergies  Medications:  Scheduled: . aspirin  325 mg Oral Daily  . cefTRIAXone (ROCEPHIN)  IV  1 g Intravenous Q24H  . heparin  5,000 Units Subcutaneous Q8H    ROS: History obtained from the patient and  sister  General ROS: negative for - chills, fatigue, fever, night sweats, weight gain or weight loss. Positive for staying cold. Psychological ROS: negative for - behavioral disorder, hallucinations, memory difficulties, mood swings or suicidal ideation Ophthalmic ROS: negative for - blurry vision, double vision, eye pain or loss of vision ENT ROS: negative for - epistaxis, nasal discharge, oral lesions, sore throat, tinnitus or vertigo Allergy and Immunology ROS: negative for - hives or itchy/watery eyes Hematological and Lymphatic ROS: negative for - bleeding problems, bruising or swollen lymph nodes Endocrine ROS: negative for - galactorrhea, hair pattern changes, polydipsia/polyuria or temperature intolerance Respiratory ROS: negative for - hemoptysis, shortness of breath or wheezing. Positive for mildly productive cough times several days Cardiovascular ROS: negative for - chest pain, dyspnea on exertion, edema or irregular heartbeat Gastrointestinal ROS: negative for - abdominal pain, diarrhea, hematemesis, nausea/vomiting or stool incontinence Genito-Urinary ROS: negative for - dysuria, hematuria, incontinence.  Positive for urinary frequency. Musculoskeletal ROS: negative for - joint swelling or muscular weakness Neurological ROS: as noted in HPI Dermatological ROS: negative for rash and skin lesion changes   Physical Examination: Blood pressure 158/90, pulse 86, temperature 98.4 F (36.9 C), temperature source Oral, resp. rate 18, height 5\' 9"  (1.753 m), weight 66.3 kg (146 lb 2.6 oz), SpO2 96.00%.  General - thin frail appearing 62 year old male in bed in no acute distress. Heart - Regular rate and rhythm with occasional ectopics Lungs - Clear to auscultation Abdomen - Soft - non tender Extremities -  Distal pulses intact - no edema Skin - Warm and dry  Neurologic Examination:  Mental Status: Alert, oriented, thought content appropriate.  Speech fluent but speaks slowly  without evidence of aphasia.  Able to follow 3 step commands without difficulty. Cranial Nerves: II: Discs not visualized; Visual fields grossly normal, pupils equal, round, reactive to light and accommodation III,IV, VI: ptosis not present, extra-ocular motions intact bilaterally V,VII: smile symmetric, facial light touch sensation normal bilaterally VIII: hearing normal bilaterally IX,X: gag reflex present XI: bilateral shoulder shrug XII: midline tongue extension Motor: Right : Upper extremity   5/5    Left:     Upper extremity   5/5  Lower extremity   5/5     Lower extremity   5/5 Tone and bulk:normal tone throughout; no atrophy noted Sensory: Pinprick and light touch intact throughout, bilaterally Deep Tendon Reflexes: 2+ and symmetric throughout Plantars: Right: downgoing   Left: downgoing Cerebellar: Finger to nose with mild difficulty using his right upper extremity, normal rapid alternating movements and normal heel-to-shin test Gait: Deferred    Laboratory Studies:  Basic Metabolic Panel:  Recent Labs Lab 02/16/13 2022 02/16/13 2101 02/17/13 0440  NA 136 141  --   K 3.8 3.8  --   CL 98 103  --   CO2 25  --   --   GLUCOSE 93 92  --   BUN 30* 30*  --   CREATININE 0.96 1.20 0.88  CALCIUM 9.2  --   --     Liver Function Tests:  Recent Labs Lab 02/16/13 2022 02/17/13 0440  AST 226* 208*  ALT 74* 69*  ALKPHOS 171* 145*  BILITOT 0.9 0.8  PROT 7.2 6.9  ALBUMIN 2.9* 2.8*   No results found for this basename: LIPASE, AMYLASE,  in the last 168 hours No results found for this basename: AMMONIA,  in the last 168 hours  CBC:  Recent Labs Lab 02/16/13 2022 02/16/13 2101 02/17/13 0440  WBC 22.0*  --  17.8*  NEUTROABS 19.9*  --   --   HGB 12.3* 13.6 11.3*  HCT 36.3* 40.0 33.3*  MCV 89.4  --  90.5  PLT 148*  --  147*    Cardiac Enzymes:  Recent Labs Lab 02/16/13 2022  TROPONINI <0.30    BNP: No components found with this basename: POCBNP,    CBG:  Recent Labs Lab 02/16/13 2103 02/17/13 0753  GLUCAP 92 87    Microbiology: No results found for this or any previous visit.  Coagulation Studies:  Recent Labs  02/16/13 2022  LABPROT 14.2  INR 1.12    Urinalysis:   Recent Labs Lab 02/16/13 2207  COLORURINE AMBER*  LABSPEC 1.031*  PHURINE 6.0  GLUCOSEU NEGATIVE  HGBUR LARGE*  BILIRUBINUR LARGE*  KETONESUR 40*  PROTEINUR >300*  UROBILINOGEN 1.0  NITRITE NEGATIVE  LEUKOCYTESUR TRACE*    Lipid Panel:    Component Value Date/Time   CHOL 175 02/17/2013 0440   TRIG 107 02/17/2013 0440   HDL 51 02/17/2013 0440   CHOLHDL 3.4 02/17/2013 0440   VLDL 21 02/17/2013 0440   LDLCALC 103* 02/17/2013 0440    HgbA1C:  Lab Results  Component Value Date   HGBA1C 5.5 02/17/2013    Urine Drug Screen:     Component Value Date/Time   LABOPIA NONE DETECTED 02/16/2013 2207   COCAINSCRNUR NONE DETECTED 02/16/2013 2207   LABBENZ NONE DETECTED 02/16/2013 2207   AMPHETMU NONE DETECTED 02/16/2013 2207   THCU NONE DETECTED  02/16/2013 2207   LABBARB NONE DETECTED 02/16/2013 2207    Alcohol Level:   Recent Labs Lab 02/16/13 2022  ETH <11    Other results: EKG: Sinus rhythm rate 76 beats per minute  Imaging:  Dg Chest 2 View 02/16/2013    No active cardiopulmonary disease.     Ct Head Wo Contrast 02/16/2013     Atrophy and chronic ischemic white matter disease without acute intracranial abnormality.     MRA Head Wo Contrast 02/17/2013    1. Mild distal small vessel disease. 2. No significant proximal stenosis, aneurysm, or branch vessel occlusion.     MRI head without contrast 02/17/2013 1. Acute nonhemorrhagic infarct involving the right caudate head  measures 14 mm maximally.  2. Punctate diffusion abnormality in the posterior left frontal lobe  may represent a resolving white matter infarct.  3. Diffuse atrophy and extensive white matter disease, suggesting  advanced microvascular ischemic  changes.  4. Multiple bilateral remote lacunar infarcts of the basal ganglia  and cerebellum bilaterally.  US Abdomen Complete 02/17/2013    1. No shadowing gallstones are noted within gallbladder. Gallbladder sludge is noted. 2. CBD dilatation measuring 1.1 cm in diameter. 3. Heterogeneous liver without biliary ductal dilatation. Question hemangioma in left hepatic lobe measures 2.6 x 2.3 cm. Further evaluation with enhanced CT or MRI could be performed. 4. No hydronephrosis or hydroureter.     Ct Abd Wo & W Cm 02/17/2013     1. Perceived mass seen on ultrasound performed earlier today represents focal fatty infiltration  2.  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   Benjamin See PA-C [facility] Pager [phone] 02/17/2013, 4:39 PM  Patient seen and examined.  Clinical course and management discussed.  Necessary edits performed.  I agree with the above.  Assessment and plan of care developed and discussed below.    Assessment: 62 y.o. male admitted 02/16/2013 after his sister found him on the floor of his home unable to get up. An MRI reviewed and shows an acute nonhemorrhagic infarct involving the right caudate head and a left parietal subacute infarct. The patient also had multiple bilateral remote lacunar infarcts of the basal ganglia and cerebellum bilaterally. Carotid dopplers are unremarkable.  Echocardiogram shows no intracardiac masses or thrombi with normal EF  LDL is elevated at 103.  A1c 5.5.  Stroke Risk Factors - hyperlipidemia, hypertension and smoking  Plan: 1. PT consult, OT consult, Speech consult 2. Prophylactic therapy-Antiplatelet med: Aspirin - dose 325 mg daily. 3. Risk factor modification.  Statin initiation. 4. Telemetry monitoring 5. Frequent neuro checks  Thana Farr, MD [facility] [phone]  02/17/2013  5:20 PM

## 2013-02-17 NOTE — Progress Notes (Signed)
  Echocardiogram 2D Echocardiogram has been performed.  Benjamin [name] 02/17/2013, 12:20 PM

## 2013-02-17 NOTE — Progress Notes (Signed)
Bilateral carotid artery duplex:  1-39% ICA stenosis.  Vertebral artery flow is antegrade.     

## 2013-02-17 NOTE — ED Notes (Signed)
[facility] here for transportation to [facility]

## 2013-02-17 NOTE — Progress Notes (Signed)
[facility] progress note Chief complaint. Transfer note. History of present illness. This 62 year old male presented to [facility] long emergency room with left leg weakness and failure to thrive. Apparently in December he experienced aphasia and difficulty ambulating but was never seen medically. He is steadily decline to where he is no longer able to ambulate.was felt the patient likely had a CVA in December but never obtained medical care. He was felt to require further stroke workup and arranged for transfer from [facility] long hospital to [facility]. Patient has now arrived in transfer and I'm seeing him to ensure that he remained stable post transfer and that his orders transferred appropriately. Patient has no current complaints. Vital signs. Temperature 98.3, pulse 85, respiration 18, blood pressure 142/76. O2 sats 100%. General appearance. Frail elderly male looks somewhat older than his stated age. He is alert and without distress. Cardiac. Rate and rhythm regular. Lungs.breath sounds clear and equal. Abdomen. Soft with positive bowel sounds. No pain. Neurologic. Cranial nerves 2-12 grossly intact. Strength somewhat weaker on the left. Impression/plan Problem #1.left leg weakness and failure to thrive. Likely patient had a CVA last December but never obtained medical care. Patient for MRI another workup for leukocytosis and elevated liver function tests. Patient clinically stable per bedside exam post transfer. All orders appear to of transferred appropriately.

## 2013-02-17 NOTE — Evaluation (Signed)
Physical Therapy Evaluation Patient Details Name: Benjamin [name] MRN: [id number] DOB: [birth year] Today's Date: 02/17/2013 Time: 4098-1191 PT Time Calculation (min): 23 min  PT Assessment / Plan / Recommendation History of Present Illness  Patient is a 62 yo male admitted with weakness, FTT.  Sister found patient on floor (patient unable to walk) and brought to ED.  Per chart, possible CVA in December 2013 - no medical attention at that time.    Clinical Impression  Patient presents with problems listed below.  Will benefit from acute PT to maximize independence prior to discharge.  Patient lives alone.  Recommend SNF for continued therapy at discharge.    PT Assessment  Patient needs continued PT services    Follow Up Recommendations  SNF    Does the patient have the potential to tolerate intense rehabilitation      Barriers to Discharge Decreased caregiver support Patient lives alone    Equipment Recommendations  None recommended by PT    Recommendations for Other Services     Frequency Min 3X/week    Precautions / Restrictions Precautions Precautions: Fall Precaution Comments: Lt sided weakness Restrictions Weight Bearing Restrictions: No   Pertinent Vitals/Pain       Mobility  Bed Mobility Bed Mobility: Rolling Left;Left Sidelying to Sit;Sitting - Scoot to [place] of Bed;Sit to Supine Rolling Left: 3: Mod assist;With rail Left Sidelying to Sit: 2: Max assist;With rails;HOB elevated Sitting - Scoot to Edge of Bed: 3: Mod assist Sit to Supine: 2: Max assist;HOB elevated Details for Bed Mobility Assistance: Verbal and tactile cues for technique.  Assist to bring RUE to rail.  Assist to bring LE's off of bed and to raise trunk to sitting.  Patient sat EOB x 12 minutes.  Assist to return to supine to lower trunk and bring LE's onto bed. Transfers Transfers: Sit to Stand;Stand to Sit Sit to Stand: 2: Max assist;From elevated surface;With upper extremity assist;From  bed Stand to Sit: 4: Min assist;With upper extremity assist;To bed Details for Transfer Assistance: Verbal cues for hand placement.  Attempted to stand x2 unsuccessfully.  Elevated bed.  Patient stood with assist to rise from bed.  Patient stood x 2 minutes. Ambulation/Gait Ambulation/Gait Assistance: Not tested (comment) Modified Rankin (Stroke Patients Only) Pre-Morbid Rankin Score: Slight disability Modified Rankin: Severe disability    Exercises General Exercises - Lower Extremity Long Arc Quad: AROM;Both;10 reps;Seated Hip ABduction/ADduction: AROM;Both;10 reps;Seated Hip Flexion/Marching: AROM;Both;10 reps;Seated Toe Raises: AROM;Both;10 reps;Seated Heel Raises: AROM;Both;10 reps;Seated   PT Diagnosis: Difficulty walking;Generalized weakness  PT Problem List: Decreased strength;Decreased activity tolerance;Decreased balance;Decreased mobility;Decreased knowledge of use of DME;Decreased safety awareness PT Treatment Interventions: DME instruction;Gait training;Functional mobility training;Therapeutic exercise;Balance training;Patient/family education     PT Goals(Current goals can be found in the care plan section) Acute Rehab PT Goals Patient Stated Goal: To get stronger PT Goal Formulation: With patient Time For Goal Achievement: 03/03/13 Potential to Achieve Goals: Good  Visit Information  Last PT Received On: 02/17/13 Assistance Needed: +2 History of Present Illness: Patient is a 62 yo male admitted with weakness, FTT.  Sister found patient on floor (patient unable to walk) and brought to ED.  Per chart, possible CVA in December 2013 - no medical attention at that time.         Prior Functioning  Home Living Family/patient expects to be discharged to:: Skilled nursing facility Living Arrangements: Alone Home Equipment: Dan Humphreys - 2 wheels;Walker - standard Prior Function Level of Independence: Independent with assistive device(s) Comments:  sister provided  transportation and groceries Communication Communication: No difficulties (Soft voice) Dominant Hand: Right    Cognition  Cognition Arousal/Alertness: Awake/alert Behavior During Therapy: Flat affect Overall Cognitive Status: No family/caregiver present to determine baseline cognitive functioning    Extremity/Trunk Assessment Upper Extremity Assessment Upper Extremity Assessment: Generalized weakness;LUE deficits/detail LUE Deficits / Details: Decreased strength compared to RUE LUE Sensation: decreased light touch Lower Extremity Assessment Lower Extremity Assessment: Generalized weakness;LLE deficits/detail LLE Deficits / Details: Decreased strength compared with RLE.  LLE strength 3+/5 LLE Sensation: decreased light touch Cervical / Trunk Assessment Cervical / Trunk Assessment: Other exceptions Cervical / Trunk Exceptions: Decreased cervical strength.  Unable to lift head from pillow.  Patient reports numbness right side of face.   Balance Balance Balance Assessed: Yes Static Sitting Balance Static Sitting - Balance Support: Bilateral upper extremity supported;Feet supported Static Sitting - Level of Assistance: 5: Stand by assistance Static Sitting - Comment/# of Minutes: 12 minutes.  Able to maintain upright position, however leaning to left.  Cues to move to midline position. Static Standing Balance Static Standing - Balance Support: Bilateral upper extremity supported Static Standing - Level of Assistance: 4: Min assist Static Standing - Comment/# of Minutes: 2 minutes.  Once upright, required min assist for balance with use of RW  End of Session PT - End of Session Equipment Utilized During Treatment: Gait belt Activity Tolerance: Patient limited by fatigue Patient left: in bed;with call bell/phone within reach Nurse Communication: Mobility status  GP     Vena Austria 02/17/2013, 11:29 AM Durenda Hurt. Renaldo Fiddler, [facility] Acute Rehab Services Pager [phone]

## 2013-02-18 DIAGNOSIS — E538 Deficiency of other specified B group vitamins: Secondary | ICD-10-CM | POA: Diagnosis present

## 2013-02-18 DIAGNOSIS — N390 Urinary tract infection, site not specified: Secondary | ICD-10-CM | POA: Diagnosis present

## 2013-02-18 LAB — COMPREHENSIVE METABOLIC PANEL
AST: 213 U/L — ABNORMAL HIGH (ref 0–37)
Albumin: 2.4 g/dL — ABNORMAL LOW (ref 3.5–5.2)
BUN: 14 mg/dL (ref 6–23)
CO2: 27 mEq/L (ref 19–32)
Calcium: 8.6 mg/dL (ref 8.4–10.5)
Chloride: 100 mEq/L (ref 96–112)
Creatinine, Ser: 0.79 mg/dL (ref 0.50–1.35)
GFR calc Af Amer: >90 mL/min (ref >90)
GFR calc non Af Amer: >90 mL/min (ref >90)
Glucose, Bld: 95 mg/dL (ref 70–99)
Total Bilirubin: 1 mg/dL (ref 0.3–1.2)

## 2013-02-18 LAB — URINE CULTURE

## 2013-02-18 LAB — CBC
Hemoglobin: 10.7 g/dL — ABNORMAL LOW (ref 13.0–17.0)
MCV: 90.3 fL (ref 78.0–100.0)
Platelets: 153 10*3/uL (ref 150–400)
RBC: 3.52 MIL/uL — ABNORMAL LOW (ref 4.22–5.81)
RDW: 12.9 % (ref 11.5–15.5)
WBC: 9.5 10*3/uL (ref 4.0–10.5)

## 2013-02-18 MED ORDER — INFLUENZA VAC SPLIT QUAD 0.5 ML IM SUSP
0.5000 mL | INTRAMUSCULAR | Status: AC
  Administered 2013-02-20: 0.5 mL via INTRAMUSCULAR
  Filled 2013-02-18: qty 0.5

## 2013-02-18 MED ORDER — VITAMIN B-12 1000 MCG PO TABS
1000.0000 ug | ORAL_TABLET | Freq: Every day | ORAL | Status: DC
  Administered 2013-02-18 – 2013-02-20 (×3): 1000 ug via ORAL
  Filled 2013-02-18 (×3): qty 1

## 2013-02-18 NOTE — Care Management Note (Unsigned)
    Page 1 of 1   02/18/2013     1:55:25 PM   CARE MANAGEMENT NOTE 02/18/2013  Patient:  Benjamin, [name]   Account Number:  [id number]  Date Initiated:  02/18/2013  Documentation initiated by:  GRAVES-BIGELOW,Tyrece Vanterpool  Subjective/Objective Assessment:   Pt admitted for Left-sided weakness. MRI revealed an acute nonhemorrhagic infarct involving the right caudate head which measured 14 mm maximally.     Action/Plan:   Plan will be for SNF when medically stable for d/c. CSW assisitning with disposition needs. CM will continue to monitor.   Anticipated DC Date:  02/19/2013   Anticipated DC Plan:  SKILLED NURSING FACILITY  In-house referral  Clinical Social Worker      DC Planning Services  CM consult      Choice offered to / List presented to:             Status of service:  In process, will continue to follow Medicare Important Message given?   (If response is "NO", the following Medicare IM given date fields will be blank) Date Medicare IM given:   Date Additional Medicare IM given:    Discharge Disposition:    Per UR Regulation:  Reviewed for med. necessity/level of care/duration of stay  If discussed at Long Length of Stay Meetings, dates discussed:    Comments:

## 2013-02-18 NOTE — Evaluation (Signed)
Occupational Therapy Evaluation Patient Details Name: Benjamin [name] MRN: [id number] DOB: 22-Nov-1950 Today's Date: 02/18/2013 Time: 4098-1191 OT Time Calculation (min): 32 min  OT Assessment / Plan / Recommendation History of present illness Patient is a 62 yo male admitted with weakness, FTT.  Sister found patient on floor (patient unable to walk) and brought to ED.  Per chart, possible CVA in December 2013 - no medical attention at that time.     Clinical Impression   Pt admitted w/ dx as above. He currently presents w/ deficits in ability to perform ADL's (see problem list below) and should benefit from acute OT followed by SNF Rehab to assist in maximizing independence in ADL & functional transfers secondary to living alone.     OT Assessment  Patient needs continued OT Services    Follow Up Recommendations  SNF    Barriers to Discharge Other (comment) (Lives alone)    Equipment Recommendations  None recommended by OT    Recommendations for Other Services    Frequency  Min 2X/week    Precautions / Restrictions Precautions Precautions: Fall Precaution Comments: Lt sided weakness Restrictions Weight Bearing Restrictions: No   Pertinent Vitals/Pain No, denies pain   ADL  Eating/Feeding: Performed;Set up Where Assessed - Eating/Feeding: Bed level Grooming: Performed;Wash/dry hands;Wash/dry face;Min guard;Set up Where Assessed - Grooming: Unsupported sitting Upper Body Bathing: Performed;Chest;Right arm;Left arm;Minimal assistance Where Assessed - Upper Body Bathing: Unsupported sitting Lower Body Bathing: Simulated;Moderate assistance Where Assessed - Lower Body Bathing: Supported sit to stand Upper Body Dressing: Performed;Min guard;Set up Where Assessed - Upper Body Dressing: Unsupported sitting Lower Body Dressing: Performed;Moderate assistance Where Assessed - Lower Body Dressing: Supported sit to stand Toilet Transfer: Simulated;Minimal assistance (Sit to stand  from EOB while RN changed linens) Toilet Transfer Method: Sit to stand;Stand pivot Acupuncturist: Bedside commode Toileting - Clothing Manipulation and Hygiene: Simulated;Moderate assistance Where Assessed - Toileting Clothing Manipulation and Hygiene: Sit to stand from 3-in-1 or toilet Tub/Shower Transfer Method: Not assessed Equipment Used: Gait belt Transfers/Ambulation Related to ADLs: Pt was overall min A sit-stand from EOB w/ bilateral UE assist. Pt stood x2 min and began to feel weak stating "I feel like I'm going to fall" VC/TC's for safety & hand placement ADL Comments: Pt was educated in role of OT and participated in grooming while sitting at EOB. Pt initially min A - min guard w/ verbal & tactile cues to shift weight & trunk for better stability. Pt w/ noted difficulty holding head upright in sitting/standing. Pt able to sit EOB x62min, then sit to stand activity while RN changed sheets on bed. Pt will benefit from cont OT .    OT Diagnosis: Generalized weakness;Cognitive deficits  OT Problem List: Decreased strength;Decreased activity tolerance;Impaired balance (sitting and/or standing);Decreased knowledge of use of DME or AE;Decreased safety awareness;Impaired UE functional use;Decreased knowledge of precautions;Decreased cognition OT Treatment Interventions: Self-care/ADL training;Therapeutic exercise;Neuromuscular education;DME and/or AE instruction;Patient/family education;Cognitive remediation/compensation;Therapeutic activities;Balance training   OT Goals(Current goals can be found in the care plan section) Acute Rehab OT Goals Patient Stated Goal: To get well and go home Time For Goal Achievement: 03/04/13 Potential to Achieve Goals: Good  Visit Information  Last OT Received On: 02/18/13 Assistance Needed: +1 History of Present Illness: Patient is a 62 yo male admitted with weakness, FTT.  Sister found patient on floor (patient unable to walk) and brought to ED.   Per chart, possible CVA in December 2013 - no medical attention at that time.  Prior Functioning     Home Living Family/patient expects to be discharged to:: Skilled nursing facility Living Arrangements: Alone Home Equipment: Dan Humphreys - 2 wheels;Walker - standard Prior Function Level of Independence: Independent with assistive device(s) Comments: sister provided transportation and groceries Communication Communication: No difficulties Dominant Hand: Right    Vision/Perception Vision - History Baseline Vision: Wears glasses all the time Patient Visual Report: No change from baseline   Cognition  Cognition Arousal/Alertness: Awake/alert Behavior During Therapy: WFL for tasks assessed/performed Overall Cognitive Status: History of cognitive impairments - at baseline    Extremity/Trunk Assessment Upper Extremity Assessment Upper Extremity Assessment: Generalized weakness LUE Deficits / Details: Decreased strength compared to RUE LUE Sensation: decreased light touch Lower Extremity Assessment Lower Extremity Assessment: Defer to PT evaluation LLE Deficits / Details: Decreased strength compared with RLE.  LLE strength 3+/5 LLE Sensation: decreased light touch Cervical / Trunk Assessment Cervical / Trunk Assessment: Other exceptions Cervical / Trunk Exceptions: Decreased cervical strength.  Unable to lift head from pillow.  Patient reports numbness right side of face.    Mobility Bed Mobility Bed Mobility: Supine to Sit;Sitting - Scoot to [place] of Bed;Sit to Supine;Scooting to [facility] Supine to Sit: 4: Min assist;HOB elevated Sitting - Scoot to Edge of Bed: 4: Min assist;With rail;Other (comment) (w/ pad assist) Sit to Supine: 4: Min assist;HOB flat Scooting to HOB: 4: Min assist;With rail Details for Bed Mobility Assistance: Verbal & tactile cues for safety, technique and hand placement. LE assist & vc's for trunk/weight shifting. Transfers Transfers: Sit to Stand;Stand to  Sit Sit to Stand: 4: Min assist;With upper extremity assist;From bed;3: Mod assist Stand to Sit: 4: Min assist;With upper extremity assist;To bed Details for Transfer Assistance: VC's for safety and sequencing. Pt reports fear of falling. Noted difficulty holding head upright during sitting/standing activities.        Balance Balance Balance Assessed: Yes Static Sitting Balance Static Sitting - Balance Support: Feet supported;Bilateral upper extremity supported Static Sitting - Level of Assistance: 4: Min assist Static Sitting - Comment/# of Minutes:  (Pt initially as above, then SBA w/ 1 UE supported during bathing) Static Standing Balance Static Standing - Balance Support: Bilateral upper extremity supported Static Standing - Level of Assistance: 4: Min assist Static Standing - Comment/# of Minutes:  (Approximately x2 min while RN changed bed linens)   End of Session OT - End of Session Equipment Utilized During Treatment: Gait belt Activity Tolerance: Patient tolerated treatment well Patient left: in bed;with call bell/phone within reach;with bed alarm set;with family/visitor present Nurse Communication: Mobility status  GO     Roselie Awkward Dixon 02/18/2013, 11:21 AM

## 2013-02-18 NOTE — Progress Notes (Addendum)
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 A day before the admission, he was not able to walk and was crawling in the house and his head was scraped on the rug -CT head showed atrophic in chronic ischemic white matter disease without acute intracranial abnormality. Small lacunar infarct in the anterior limb of the right internal capsule likely chronic. - MRI of the brain showed acute nonhemorrhagic infarct in the right caudate head, punctate diffusion abnormality and posterior left frontal lobe, diffuse atrophy, multiple bilateral remote lacunar infarcts of the basal ganglia and cerebellum bilaterally - MRA showed mild distal small vessel disease - 2-D echo showed EF of 6-70%, no patent foramina ovale - Carotid Dopplers showed bilateral 1-39% ICA stenosis - PT OT evaluation recommended skilled nursing facility - Lipid panel shows cholesterol 145, LDL 103. Will not place on statin due to acute transaminitis  - Hemoglobin A1c 5.5 -Continue aspirin 325 mg daily   Active Problems: Mass on the back: Soft, mobile, ? Lipoma - Per patient has been there for last 3-4 weeks, discussed with surgery, recommended outpatient evaluation, placed on AVS   Transaminitis: States that he has quit drinking alcohol - RUQ US shows 2.3 cm x2.6 cm echogenic area in left lobe of the liver,  - CT of the abdomen showed focal fatty infiltration, occlusion of the left iliac artery, no symptoms of acute peripheral vascular disease will need outpatient vascular surgery work-up.     Leukocytosis: Likely due to  UTI Urine culture showed only 30,000 colonies, on Rocephin IV     Failure to thrive in adult, appears to have some dementia as well - Will need a skilled nursing facility   abrasions of the head and forehead  -wound care consult  DVT Prophylaxis: heparin subcutaneous   Code Status:  Disposition:    Subjective: His sister is at the bedside  Objective: Weight change:   Intake/Output Summary (Last 24 hours) at 02/18/13 1309 Last data filed at 02/18/13 0402  Gross per 24 hour  Intake    240 ml  Output    500 ml  Net   -260 ml   Blood pressure 145/81, pulse 76, temperature 98.3 F (36.8 C), temperature source Oral, resp. rate 18, height 5\' 9"  (1.753 m), weight 66.3 kg (146 lb 2.6 oz), SpO2 96.00%.  Physical Exam: General: Alert and awake, oriented x3, not in any acute distress. Bandage on head, abrasions on forehead CVS: S1-S2 clear,  Chest: CTAB Abdomen: soft nontender, nondistended, normal bowel sounds  Extremities: no c/c/e bilaterally Neuro: Cranial nerves II-XII intact, LUE and LLE 4/5, RUE and RLE 5/5 Back; mobile 3cm mass on upper back, mobile, soft, no tender  Lab Results: Basic Metabolic Panel:  Recent Labs Lab 02/16/13 2022 02/16/13 2101 02/17/13 0440 02/18/13 0514  NA 136 141  --  135  K 3.8 3.8  --  3.0*  CL 98 103  --  100  CO2 25  --   --  27  GLUCOSE 93 92  --  95  BUN 30* 30*  --  14  CREATININE  0.96 1.20 0.88 0.79  CALCIUM 9.2  --   --  8.6   Liver Function Tests:  Recent Labs Lab 02/17/13 0440 02/18/13 0514  AST 208* 213*  ALT 69* 94*  ALKPHOS 145* 161*  BILITOT 0.8 1.0  PROT 6.9 6.1  ALBUMIN 2.8* 2.4*   No results found for this basename: LIPASE, AMYLASE,  in the last 168 hours No results found for this basename: AMMONIA,  in the last 168 hours CBC:  Recent Labs Lab 02/16/13 2022  02/17/13 0440 02/18/13 0514  WBC 22.0*  --  17.8* 9.5  NEUTROABS 19.9*  --   --   --   HGB 12.3*  < > 11.3* 10.7*  HCT 36.3*  < > 33.3*  31.8*  MCV 89.4  --  90.5 90.3  PLT 148*  --  147* 153  < > = values in this interval not displayed. Cardiac Enzymes:  Recent Labs Lab 02/16/13 2022  TROPONINI <0.30   BNP: No components found with this basename: POCBNP,  CBG:  Recent Labs Lab 02/16/13 2103 02/17/13 0753 02/17/13 1642  GLUCAP 92 87 127*     Micro Results: Recent Results (from the past 240 hour(s))  URINE CULTURE     Status: None   Collection Time    02/16/13 10:07 PM      Result Value Range Status   Specimen Description URINE, RANDOM   Final   Special Requests NONE   Final   Culture  Setup Time     Final   Value: 02/17/2013 05:59     Performed at [facility] Count     Final   Value: 30,000 COLONIES/ML     Performed at [facility]   Culture     Final   Value: Multiple bacterial morphotypes present, none predominant. Suggest appropriate recollection if clinically indicated.     Performed at [facility]   Report Status 02/18/2013 FINAL   Final    Studies/Results: Dg Chest 2 View  02/16/2013   CLINICAL DATA:  Left-sided weakness.  EXAM: CHEST  2 VIEW  COMPARISON:  None.  FINDINGS: Cardiopericardial silhouette within normal limits. Mediastinal contours normal. Trachea midline. No airspace disease or effusion. Monitoring leads project over the chest.  IMPRESSION: No active cardiopulmonary disease.   Electronically Signed   By: Andreas Newport M.D.   On: 02/16/2013 21:46   Ct Head Wo Contrast  02/16/2013   CLINICAL DATA:  Weakness. History of stroke.  EXAM: CT HEAD WITHOUT CONTRAST  TECHNIQUE: Contiguous axial images were obtained from the base of the skull through the vertex without intravenous contrast.  COMPARISON:  None.  FINDINGS: No mass lesion, mass effect, midline shift, hydrocephalus, hemorrhage. No acute territorial cortical ischemia/infarct. Atrophy and chronic ischemic white matter disease is present. Small lacunar infarct is present in the anterior limb of  the right internal capsule, likely chronic. Calvarium intact. Left maxillary mucosal thickening. Question prior left maxillary antrectomy. Intracranial atherosclerosis.  IMPRESSION: Atrophy and chronic ischemic white matter disease without acute intracranial abnormality.   Electronically Signed   By: Andreas Newport M.D.   On: 02/16/2013 20:45   US Abdomen Complete  02/17/2013   CLINICAL DATA:  Elevated LFTs  EXAM: ULTRASOUND ABDOMEN COMPLETE  COMPARISON:  None.  FINDINGS: Gallbladder  Gallbladder sludge is noted. No shadowing gallstones. No thickening of gallbladder wall. No sonographic Murphy's sign.  Common bile duct  Diameter: There is CBD dilatation measuring 11 mm in diameter.  Liver  There liver is heterogeneous. There is echogenic area in left hepatic lobe measures 2.6 x 2.3 cm. Statistically this may represent a hemangioma. Confirmation with enhanced CT or MRI could be performed. No intrahepatic biliary ductal dilatation.  IVC  No abnormality visualized.  Pancreas  No focal pancreatic abnormality. Main pancreatic duct measures 2.9 mm in diameter.  Spleen  Size and appearance within normal limits.  Measures 6 cm in length.  Right Kidney  Length: 10.9 cm. Echogenicity within normal limits. No mass or hydronephrosis visualized.  Left Kidney  Length: 10 cm. Echogenicity within normal limits. No mass or hydronephrosis visualized.  Abdominal aorta  No aneurysm visualized.  Measures up to 2.5 cm in diameter.  IMPRESSION: 1. No shadowing gallstones are noted within gallbladder. Gallbladder sludge is noted. 2. CBD dilatation measuring 1.1 cm in diameter. 3. Heterogeneous liver without biliary ductal dilatation. Question hemangioma in left hepatic lobe measures 2.6 x 2.3 cm. Further evaluation with enhanced CT or MRI could be performed. 4. No hydronephrosis or hydroureter.   Electronically Signed   By: Natasha Mead M.D.   On: 02/17/2013 09:46    Medications: Scheduled Meds: . aspirin  325 mg Oral Daily  .  cefTRIAXone (ROCEPHIN)  IV  1 g Intravenous Q24H  . heparin  5,000 Units Subcutaneous Q8H  . [START ON 02/19/2013] influenza vac split quadrivalent PF  0.5 mL Intramuscular Tomorrow-1000  . vitamin B-12  1,000 mcg Oral Daily      LOS: 2 days   Coal Nearhood M.D. [facility] 02/18/2013, 1:09 PM Pager: [phone]  If 7PM-7AM, please contact night-coverage [email or web address] Password [email or web address]

## 2013-02-18 NOTE — Progress Notes (Signed)
Assisting Clinical Child psychotherapist (CSW) has left a message for the financial counselor to assist with a Medicaid application for potential SNF placement.   Full assessment of pt to follow.  Theresia Bough, MSW, LCSW [phone]

## 2013-02-18 NOTE — Progress Notes (Signed)
Clinical Social Work Department BRIEF PSYCHOSOCIAL ASSESSMENT 02/18/2013  Patient:  Benjamin [name], Benjamin [name]     Account Number:  [id number]     Admit date:  02/16/2013  Clinical Social Worker:  Harless Nakayama  Date/Time:  02/18/2013 01:15 PM  Referred by:  Physician  Date Referred:  02/18/2013 Referred for  SNF Placement   Other Referral:   Interview type:  Family Other interview type:   Spoke with pt sister Renato Gails who was at pt bedside.    PSYCHOSOCIAL DATA Living Status:  ALONE Admitted from facility:   Level of care:   Primary support name:  Renato Gails [phone] Primary support relationship to patient:  SIBLING Degree of support available:   Pt has supportive sister    CURRENT CONCERNS Current Concerns  Post-Acute Placement   Other Concerns:    SOCIAL WORK ASSESSMENT / PLAN CSW received consult to speak with pt/pt family about SNF for ST rehab. CSW spoke with pt sister Renato Gails who was at bedside. Pt sister aware of recommendation for SNF and understanding. CSW explained LOG and SNF referral process. CSW informed pt sister that because pt will have an LOG we may not be able to find a SNF in [place] and would need to fax out to all surrounding counties. Pt sister not pleased about possibility of pt being further away but understanding and agreeable to pt being referred out as far as necessary.   Assessment/plan status:  Psychosocial Support/Ongoing Assessment of Needs Other assessment/ plan:   Information/referral to community resources:   SNF List    PATIENT'S/FAMILY'S RESPONSE TO PLAN OF CARE: Pt sister aware of recommendation for dc to SNF and agreeable.       Wynelle Dreier, LCSWA [phone]

## 2013-02-18 NOTE — Progress Notes (Signed)
UR Completed Bethaney Oshana Graves-Bigelow, RN,BSN [phone]  

## 2013-02-18 NOTE — Progress Notes (Signed)
Clinical Social Work Department CLINICAL SOCIAL WORK PLACEMENT NOTE 02/18/2013  Patient:  Benjamin [name], Benjamin [name]  Account Number:  [id number] Admit date:  02/16/2013  Clinical Social Worker:  Sharol Harness, Theresia Majors  Date/time:  02/18/2013 01:40 PM  Clinical Social Work is seeking post-discharge placement for this patient at the following level of care:   SKILLED NURSING   (*CSW will update this form in Epic as items are completed)   02/18/2013  Patient/family provided with [facility] Department of Clinical Social Work's list of facilities offering this level of care within the geographic area requested by the patient (or if unable, by the patient's family).  02/18/2013  Patient/family informed of their freedom to choose among providers that offer the needed level of care, that participate in Medicare, Medicaid or managed care program needed by the patient, have an available bed and are willing to accept the patient.  02/18/2013  Patient/family informed of [facility]' ownership interest in [facility], as well as of the fact that they are under no obligation to receive care at this facility.  PASARR submitted to EDS on 02/18/2013 PASARR number received from EDS on 02/18/2013  FL2 transmitted to all facilities in geographic area requested by pt/family on  02/18/2013 FL2 transmitted to all facilities within larger geographic area on 02/18/2013  Patient informed that his/her managed care company has contracts with or will negotiate with  certain facilities, including the following:     Patient/family informed of bed offers received:   Patient chooses bed at  Physician recommends and patient chooses bed at    Patient to be transferred to  on   Patient to be transferred to facility by   The following physician request were entered in Epic:   Additional Comments:   Brytni Dray, LCSWA [phone]

## 2013-02-18 NOTE — Progress Notes (Signed)
Stroke Team Progress Note  HISTORY Benjamin [name] is an 62 y.o. male admitted to [facility] yesterday after his sister found him on the floor of his home unable to get up. 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 The patient's sister is in the room today and she gives most of the history. Apparently in 2010 she noted that the patient was slurring his speech. This has occurred intermittently since that time. The patient has also complained of intermittent numbness of his upper extremities at different times. Neurology has been asked to see the patient and to make recommendations regarding treatment.   Date last known well: Date: 02/14/2013  Time last known well: Unable to determine  tPA Given: No: The patient was outside the window for treatment   He was admitted for further evaluation and treatment.  SUBJECTIVE He is lying in the bed.  Overall he feels his condition is gradually improving.    OBJECTIVE Most recent Vital Signs: Filed Vitals:   02/17/13 1644 02/17/13 2007 02/17/13 2352 02/18/13 0357  BP: 130/72 138/68 138/70 134/82  Pulse: 81 75 81 73  Temp: 99.3 F (37.4 C) 100.2 F (37.9 C) 98.7 F (37.1 C) 98.8 F (37.1 C)  TempSrc: Oral Oral Oral Oral  Resp: 17 17 18 18   Height:      Weight:      SpO2: 99% 95% 98% 97%   CBG (last 3)   Recent Labs  02/16/13 2103 02/17/13 0753 02/17/13 1642  GLUCAP 92 87 127*    IV Fluid Intake:   . sodium chloride 20 mL/hr at 02/18/13 0636    MEDICATIONS  . aspirin  325 mg Oral Daily  . cefTRIAXone (ROCEPHIN)  IV  1 g Intravenous Q24H  . heparin  5,000 Units Subcutaneous Q8H  . [START ON 02/19/2013] influenza vac split quadrivalent PF  0.5 mL Intramuscular Tomorrow-1000   PRN:    Diet:  Cardiac thin liquids Activity:  Bedrest  with Bathroom privileges DVT Prophylaxis:  Heparin SQ  CLINICALLY SIGNIFICANT STUDIES Basic Metabolic Panel:  Recent Labs Lab 02/16/13 2022 02/16/13 2101 02/17/13 0440 02/18/13 0514  NA 136 141  --  135  K 3.8 3.8  --  3.0*  CL 98 103  --  100  CO2 25  --   --  27  GLUCOSE 93 92  --  95  BUN 30* 30*  --  14  CREATININE 0.96 1.20 0.88 0.79  CALCIUM 9.2  --   --  8.6   Liver Function Tests:  Recent Labs Lab 02/17/13 0440 02/18/13 0514  AST 208* 213*  ALT 69* 94*  ALKPHOS 145* 161*  BILITOT 0.8 1.0  PROT 6.9 6.1  ALBUMIN 2.8* 2.4*   CBC:  Recent Labs Lab 02/16/13 2022  02/17/13 0440 02/18/13 0514  WBC 22.0*  --  17.8* 9.5  NEUTROABS 19.9*  --   --   --   HGB 12.3*  < > 11.3* 10.7*  HCT 36.3*  < > 33.3* 31.8*  MCV 89.4  --  90.5 90.3  PLT 148*  --  147* 153  < > = values in this interval not displayed. Coagulation:  Recent Labs Lab 02/16/13 2022  LABPROT 14.2  INR 1.12   Cardiac Enzymes:  Recent Labs Lab 02/16/13 2022  TROPONINI <0.30  Urinalysis:  Recent Labs Lab 02/16/13 2207  COLORURINE AMBER*  LABSPEC 1.031*  PHURINE 6.0  GLUCOSEU NEGATIVE  HGBUR LARGE*  BILIRUBINUR LARGE*  KETONESUR 40*  PROTEINUR >300*  UROBILINOGEN 1.0  NITRITE NEGATIVE  LEUKOCYTESUR TRACE*   Lipid Panel    Component Value Date/Time   CHOL 175 02/17/2013 0440   TRIG 107 02/17/2013 0440   HDL 51 02/17/2013 0440   CHOLHDL 3.4 02/17/2013 0440   VLDL 21 02/17/2013 0440   LDLCALC 103* 02/17/2013 0440   HgbA1C  Lab Results  Component Value Date   HGBA1C 5.5 02/17/2013    Urine Drug Screen:     Component Value Date/Time   LABOPIA NONE DETECTED 02/16/2013 2207   COCAINSCRNUR NONE DETECTED 02/16/2013 2207   LABBENZ NONE DETECTED 02/16/2013 2207   AMPHETMU NONE DETECTED 02/16/2013 2207   THCU NONE DETECTED 02/16/2013 2207   LABBARB NONE DETECTED 02/16/2013 2207    Alcohol Level:  Recent Labs Lab 02/16/13 2022  ETH <11    Dg Chest 2 View 02/16/2013 :  No active cardiopulmonary disease.    Ct Head Wo Contrast 02/16/2013 : Atrophy and chronic ischemic white matter disease without acute intracranial abnormality.     [name] Head Wo Contrast 02/17/2013  1. Mild distal small vessel disease. 2. No significant proximal stenosis, aneurysm, or branch vessel occlusion.     US Abdomen Complete 02/17/2013   1. No shadowing gallstones are noted within gallbladder. Gallbladder sludge is noted. 2. CBD dilatation measuring 1.1 cm in diameter. 3. Heterogeneous liver without biliary ductal dilatation. Question hemangioma in left hepatic lobe measures 2.6 x 2.3 cm. Further evaluation with enhanced CT or MRI could be performed. 4. No hydronephrosis or hydroureter.       Ct Abd Wo & W Cm 02/17/2013  : 1. Perceived mass seen on ultrasound performed earlier today represents focal fatty infiltration  2.  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    MRI of the brain    2D Echocardiogram   - Left ventricle: Systolic function was vigorous. The estimated ejection fraction was in the range of 65% to 70%. Left ventricular diastolic function parameters were normal. - Atrial septum: No defect or patent foramen ovale was Identified  Carotid Doppler   Bilateral carotid artery duplex: 1-39% ICA stenosis. Vertebral artery flow is antegrade.   CXR    EKG  normal sinus rhythm.   Therapy Recommendations SNF  Physical Exam   Mental Status:  Alert, oriented, thought content appropriate. Speech fluent but speaks slowly without evidence of aphasia. Able to follow 3 step commands without difficulty.  Cranial Nerves:  II: Discs not visualized; Visual fields grossly normal, pupils equal, round, reactive to light and accommodation  III,IV, VI: ptosis not present, extra-ocular motions intact bilaterally  V,VII: smile symmetric, facial light touch sensation normal bilaterally  VIII: hearing normal bilaterally  IX,X: gag reflex present  XI: bilateral  shoulder shrug  XII: midline tongue extension  Motor:  Right : Upper extremity 5/5 Left: Upper extremity 5/5  Lower extremity 5/5 Lower extremity 5/5  Tone and bulk:normal tone throughout; no atrophy noted  Sensory: Pinprick and light touch intact throughout, bilaterally  Deep Tendon Reflexes: 2+ and symmetric throughout  Plantars:  Right: downgoing Left: downgoing  Cerebellar:  Finger to nose with mild difficulty using his right upper extremity, normal rapid alternating movements and normal heel-to-shin test  Gait: Deferred   ASSESSMENT Benjamin [name] is a 62 y.o. male presenting  with found down then had slurred speech. Imaging confirms no acute stroke. On no antithrombotics prior to admission. Now on aspirin 325 mg orally every day for secondary stroke prevention. Patient with resultant transient symptoms. Work up completed.   Elevated liver function tests  Hyperlipidemia, LDL 103, goal < 100; however cannot add statin due to elevated liver function tests  Abnormal liver/gallbladder per primary team  Hospital day # 2  TREATMENT/PLAN  Continue aspirin 325mg  daily for secondary stroke prevention.  Would hold statin since liver function test are elevated  Stroke service will sign off.  Patient may follow up primary care physician or with Dr. Pearlean Brownie in stroke clinic in 2 months.  Gwendolyn Lima. Manson Passey, [facility], MBA, MHA [facility] Pager: [phone] 02/18/2013 9:25 AM  I have personally obtained a history, examined the patient, evaluated imaging results, and formulated the assessment and plan of care. I agree with the above. Delia Heady, MD

## 2013-02-19 DIAGNOSIS — L02219 Cutaneous abscess of trunk, unspecified: Secondary | ICD-10-CM

## 2013-02-19 DIAGNOSIS — L03319 Cellulitis of trunk, unspecified: Secondary | ICD-10-CM

## 2013-02-19 MED ORDER — VANCOMYCIN HCL IN DEXTROSE 1-5 GM/200ML-% IV SOLN
1000.0000 mg | Freq: Two times a day (BID) | INTRAVENOUS | Status: DC
  Administered 2013-02-19 – 2013-02-20 (×3): 1000 mg via INTRAVENOUS
  Filled 2013-02-19 (×4): qty 200

## 2013-02-19 MED ORDER — HEPARIN SODIUM (PORCINE) 5000 UNIT/ML IJ SOLN
5000.0000 [IU] | Freq: Three times a day (TID) | INTRAMUSCULAR | Status: DC
  Administered 2013-02-19 – 2013-02-20 (×2): 5000 [IU] via SUBCUTANEOUS
  Filled 2013-02-19 (×5): qty 1

## 2013-02-19 MED ORDER — SODIUM CHLORIDE 0.9 % IV SOLN: INTRAVENOUS | Status: DC

## 2013-02-19 MED ORDER — LIDOCAINE-EPINEPHRINE 2 %-1:100000 IJ SOLN
20.0000 mL | Freq: Once | INTRAMUSCULAR | Status: DC
  Filled 2013-02-19: qty 20

## 2013-02-19 MED ORDER — HYDROCERIN EX CREA: TOPICAL_CREAM | Freq: Two times a day (BID) | CUTANEOUS | Status: DC

## 2013-02-19 MED ORDER — HYDROCERIN EX CREA
TOPICAL_CREAM | Freq: Every day | CUTANEOUS | Status: DC
  Administered 2013-02-19 – 2013-02-20 (×2): via TOPICAL
  Filled 2013-02-19: qty 113

## 2013-02-19 MED ORDER — BACITRACIN-NEOMYCIN-POLYMYXIN OINTMENT TUBE
TOPICAL_OINTMENT | Freq: Every day | CUTANEOUS | Status: DC
  Administered 2013-02-19 – 2013-02-20 (×2): via TOPICAL
  Filled 2013-02-19: qty 15

## 2013-02-19 NOTE — Consult Note (Addendum)
WOC wound consult note Reason for Consult: CCS team following for raised lesion on back; this site not assessed. Consult requested for BLE.  Pt fell at home and has "rugburn" where he states  fell at home and carpet was against his forehead 4X1.5X.1cm has evolved into partial thickness skin loss.  Area 100% red and moist.  No odor or drainage.   Plan: Neosporin to promote healing.   Right knee with intact blister 1X1cm.  This occurred when pt was crawling on rug, he states.  Plan: foam dressing to protect and promote healing. Wound type:Bilat legs with patchy areas of dry peeling skin and some small areas of partial thickness skin loss. Appearance consistent with venous stasis changes to BLE. No significant wounds or drainage requiring topical treatment.   Plan: Eucerin cream to promote healing. Please re-consult if further assistance is needed.  Thank-you,  Cammie Mcgee MSN, RN, CWOCN, [place], CNS [phone]

## 2013-02-19 NOTE — Progress Notes (Signed)
CSW Proofreader) spoke with pt sister and updated that there were currently no bed offers. Pt sister says at this time she has been unable to work out a way for pt to return home with family. CSW to continue to search for SNF bed and pt sister to continue to work on pt possibly discharging home with famiy.  Logan Vegh, LCSWA [phone]

## 2013-02-19 NOTE — Consult Note (Signed)
Likely infected sebaceous cyst.  Will I&D later today.  Benjamin [name]. Corliss Skains, MD, [facility] Surgery  General/ Trauma Surgery  02/19/2013 2:46 PM

## 2013-02-19 NOTE — Procedures (Signed)
Incision and Drainage Procedure Note  Pre-operative Diagnosis: right back superficial abscess  Post-operative Diagnosis: infected sebaceous cyst  Procedure: Simple incision and drainage   Indications: pain, infection, drainage.  Anesthesia: lidocaine 1% with epinephrine   Procedure Details  The procedure, risks and complications have been discussed in detail (including, but not limited to airway compromise, infection, bleeding) with the patient, and the patient has signed consent to the procedure.  The skin was sterilely prepped and draped over the affected area in the usual fashion. After adequate local anesthesia, I&D with a #11 blade was performed on the right upper back. Purulent drainage: present.  Approximately 3x3cm elliptical incision was made to allow for adequate drainage and healing.  Manual pressure was applied to achieve hemostasis.  A wet to dry dressing was applied. The patient was observed until stable.  I advised the nurse to monitor the patient for additional bleeding.  We will change his dressing tomorrow.  He is on Vancomycin.  He may be changed to oral medication such as keflex or clindamycin for 7 days.  He will need BID NS wet to dry dressing changes.    Findings: Infected sebaceous cyst  EBL: <5 cc's  Drains: none  Condition: Tolerated procedure well and Stable   Complications: none.  Roschelle Calandra, ANP-BC

## 2013-02-19 NOTE — Consult Note (Signed)
ANTIBIOTIC CONSULT NOTE - INITIAL  Pharmacy Consult for Vancomycin Indication: back abscess  No Known Allergies  Patient Measurements: Height: 5\' 9"  (175.3 cm) Weight: 146 lb 2.6 oz (66.3 kg) IBW/kg (Calculated) : 70.7  Vital Signs: Temp: 98.5 F (36.9 C) (10/28 0955) Temp src: Oral (10/28 0955) BP: 133/74 mmHg (10/28 0955) Pulse Rate: 74 (10/28 0955) Intake/Output from previous day: 10/27 0701 - 10/28 0700 In: 480 [P.O.:480] Out: 1050 [Urine:1050] Intake/Output from this shift: Total I/O In: 360 [P.O.:360] Out: -   Labs:  Recent Labs  02/16/13 2022 02/16/13 2101 02/17/13 0440 02/18/13 0514  WBC 22.0*  --  17.8* 9.5  HGB 12.3* 13.6 11.3* 10.7*  PLT 148*  --  147* 153  CREATININE 0.96 1.20 0.88 0.79   Estimated Creatinine Clearance: 92.1 ml/min (by C-G formula based on Cr of 0.79).  Microbiology: Recent Results (from the past 720 hour(s))  URINE CULTURE     Status: None   Collection Time    02/16/13 10:07 PM      Result Value Range Status   Specimen Description URINE, RANDOM   Final   Special Requests NONE   Final   Culture  Setup Time     Final   Value: 02/17/2013 05:59     Performed at [facility] Count     Final   Value: 30,000 COLONIES/ML     Performed at [facility]   Culture     Final   Value: Multiple bacterial morphotypes present, none predominant. Suggest appropriate recollection if clinically indicated.     Performed at [facility]   Report Status 02/18/2013 FINAL   Final    Medical History: Past Medical History  Diagnosis Date  . Stroke    Assessment: 61yom with a mass on his back that has been there for ~ 3-4 weeks. He has been afebrile and WBC wnl now but has been elevated since admission (22-->17.8-->9.5). Surgery contacted but wanted to address the mass outpatient. He will now begin vancomycin for possible abscess . Renal function wnl.  10/26 Ceftriaxone>> 10/25 urine cx>> 30k mult bacteria,  final  Goal of Therapy:  Vancomycin trough level 10-15 mcg/ml  Plan:  1) Vancomycin 1g IV q12 2) Follow renal function, cultures, LOT, trough at steady state  Fredrik Rigger 02/19/2013,11:45 AM

## 2013-02-19 NOTE — Procedures (Signed)
Benjamin [name]. Corliss Skains, MD, [facility] Surgery  General/ Trauma Surgery  02/19/2013 2:46 PM

## 2013-02-19 NOTE — Consult Note (Signed)
Reason for Consult: back abscess Referring Physician: Cathren Harsh, MD   Benjamin [name] is an 62 y.o. male.  HPI: Patient is a 61 year old male who was admitted with left leg weakness and failure to thrive. Patient was brought to the ER by his sister as he was having trouble with walking. Patient had a sudden onset of aphasia in December 2014 and trouble walking but he did not seek any medical attention. Prior to admission, he was feeling weaker and was not able to walk, was crawling around in the house and experienced a rug burn on his head.Evaluation in the ER included a negative head CT, WBC of 22K, Hb 13g/dL, and elevation of LFTs.  MRI of the brain was positive for acute nonhemorrhagic infarct in the right caudate head, patient underwent full stroke workup.  Patient was found to have UTI, started on IV Rocephin however urine culture showed only 30,000 colonies  [name] also reported of a mass yesterday 10/27, on his back for last 3-4 weeks,he says it's 2-3 weeks,  initially felt to be soft, nontender. Originally thought to be a lipoma and was to be addressed as an outpatient. Now it has started draining and we are ask to see.  Past Medical History  Diagnosis Date  . Stroke     Past Surgical History  Procedure Laterality Date  . Other surgical history      Stent, unknown    History reviewed. No pertinent family history.  Social History:  reports that he has quit smoking. He has never used smokeless tobacco. He reports that he does not drink alcohol or use illicit drugs.  Allergies: No Known Allergies  Medications:  Prior to Admission:  Prescriptions prior to admission  Medication Sig Dispense Refill  . cholecalciferol (VITAMIN D) 1000 UNITS tablet Take 1,000 Units by mouth every morning.      . furosemide (LASIX) 40 MG tablet Take 40 mg by mouth every morning.      . meloxicam (MOBIC) 7.5 MG tablet Take 7.5 mg by mouth daily.       Scheduled: . aspirin  325 mg Oral  Daily  . heparin  5,000 Units Subcutaneous Q8H  . hydrocerin   Topical Daily  . influenza vac split quadrivalent PF  0.5 mL Intramuscular Tomorrow-1000  . lidocaine-EPINEPHrine  20 mL Infiltration Once  . neomycin-bacitracin-polymyxin   Topical Daily  . vancomycin  1,000 mg Intravenous Q12H  . vitamin B-12  1,000 mcg Oral Daily   Continuous: . sodium chloride 20 mL/hr at 02/18/13 0636   PRN: Anti-infectives   Start     Dose/Rate Route Frequency Ordered Stop   02/19/13 1300  vancomycin (VANCOCIN) IVPB 1000 mg/200 mL premix     1,000 mg 200 mL/hr over 60 Minutes Intravenous Every 12 hours 02/19/13 1153     02/17/13 0800  cefTRIAXone (ROCEPHIN) 1 g in dextrose 5 % 50 mL IVPB  Status:  Discontinued     1 g 100 mL/hr over 30 Minutes Intravenous Every 24 hours 02/17/13 0715 02/19/13 1217      Results for orders placed during the hospital encounter of 02/16/13 (from the past 48 hour(s))  GLUCOSE, CAPILLARY     Status: Abnormal   Collection Time    02/17/13  4:42 PM      Result Value Range   Glucose-Capillary 127 (*) 70 - 99 mg/dL  COMPREHENSIVE METABOLIC PANEL     Status: Abnormal   Collection Time    02/18/13  5:14 AM      Result Value Range   Sodium 135  135 - 145 mEq/L   Potassium 3.0 (*) 3.5 - 5.1 mEq/L   Chloride 100  96 - 112 mEq/L   CO2 27  19 - 32 mEq/L   Glucose, Bld 95  70 - 99 mg/dL   BUN 14  6 - 23 mg/dL   Creatinine, Ser 0.98  0.50 - 1.35 mg/dL   Calcium 8.6  8.4 - 11.9 mg/dL   Total Protein 6.1  6.0 - 8.3 g/dL   Albumin 2.4 (*) 3.5 - 5.2 g/dL   AST 147 (*) 0 - 37 U/L   ALT 94 (*) 0 - 53 U/L   Alkaline Phosphatase 161 (*) 39 - 117 U/L   Total Bilirubin 1.0  0.3 - 1.2 mg/dL   GFR calc non Af Amer >90  >90 mL/min   GFR calc Af Amer >90  >90 mL/min   Comment: (NOTE)     The eGFR has been calculated using the CKD EPI equation.     This calculation has not been validated in all clinical situations.     eGFR's persistently <90 mL/min signify possible Chronic  Kidney     Disease.  CBC     Status: Abnormal   Collection Time    02/18/13  5:14 AM      Result Value Range   WBC 9.5  4.0 - 10.5 K/uL   RBC 3.52 (*) 4.22 - 5.81 MIL/uL   Hemoglobin 10.7 (*) 13.0 - 17.0 g/dL   HCT 82.9 (*) 56.2 - 13.0 %   MCV 90.3  78.0 - 100.0 fL   MCH 30.4  26.0 - 34.0 pg   MCHC 33.6  30.0 - 36.0 g/dL   RDW 86.5  78.4 - 69.6 %   Platelets 153  150 - 400 K/uL  GLUCOSE, CAPILLARY     Status: Abnormal   Collection Time    02/18/13  9:05 PM      Result Value Range   Glucose-Capillary 108 (*) 70 - 99 mg/dL   Comment 1 Notify RN      [name] Head Wo Contrast  02/17/2013   CLINICAL DATA:  Left leg weakness. Failure to thrive. CVA.  EXAM: MRI HEAD WITHOUT CONTRAST  MRA HEAD WITHOUT CONTRAST  TECHNIQUE: Multiplanar, multiecho pulse sequences of the brain and surrounding structures were obtained without intravenous contrast. Angiographic images of the head were obtained using MRA technique without contrast.  COMPARISON:  And CT head without contrast 02/16/2013  FINDINGS: MRI HEAD FINDINGS  An acute nonhemorrhagic infarct of the right caudate head measures 14 mm maximally. A punctate area of diffusion signal is present in the posterior left frontal lobe. This is not clearly evident on the ADC map the may represent a slightly later infarct. Extensive periventricular and subcortical T2 hyperintensities are present bilaterally. Remote lacunar infarcts are present in the basal ganglia bilaterally. Remote lacunar infarcts present in the thalami and posterior progressive remote lacunar infarcts are present in the thalami and cerebellum bilaterally as well.  Flow is present in the major intracranial arteries. The globes and orbits are intact. There is residual opacification in the left maxillary sinus despite a maxillary antrostomy. Mild mucosal thickening is present in the right maxillary sinus and ethmoid air cells. Minimal fluid is present in the right mastoid air cells. No obstructing  nasopharyngeal lesion is evident.  MRA HEAD FINDINGS  The internal carotid arteries are within normal limits from  the high cervical segments through the ICA termini bilaterally. The right A1 segment is dominant. The M1 segments are within normal limits. No significant anterior communicating artery is present. There is mild narrowing of the proximal left A1 segment. Mild distal MCA branch vessel attenuation is present bilaterally. There is no significant proximal stenosis or occlusion.  The left vertebral artery is slightly dominant to the right. The PICA origins are not visualized. The basilar artery is within normal limits. Both posterior cerebral arteries originate from the basilar tip. There is mild attenuation of distal PCA branch vessels.  IMPRESSION: MRI HEAD IMPRESSION  1. Acute nonhemorrhagic infarct involving the right caudate head measures 14 mm maximally. 2. Punctate diffusion abnormality in the posterior left frontal lobe may represent a resolving white matter infarct. 3. Diffuse atrophy and extensive white matter disease, suggesting advanced microvascular ischemic changes. 4. Multiple bilateral remote lacunar infarcts of the basal ganglia and cerebellum bilaterally.  MRA HEAD IMPRESSION  1. Mild distal small vessel disease. 2. No significant proximal stenosis, aneurysm, or branch vessel occlusion.   Electronically Signed   By: Gennette Pac M.D.   On: 02/17/2013 14:34   Ct Abd Wo & W Cm  02/17/2013   CLINICAL DATA:  2.6 cm hyperechoic mass seen in the left lobe of the liver on ultrasound performed today  EXAM: CT ABDOMEN WITHOUT AND WITH CONTRAST  TECHNIQUE: Multidetector CT imaging of the abdomen was performed following the standard protocol before and following the bolus administration of intravenous contrast.  CONTRAST:  OMNIPAQUE IOHEXOL 300 MG/ML  SOLN  COMPARISON:  Ultrasound performed earlier today  FINDINGS: Mass in the left lobe of the liver is identified, as on ultrasound, as being  located adjacent to the falciform ligament. It shows low attenuation on precontrast and post-contrast images with no evidence of enhancement. In it demonstrates an average post contrast attenuation value of 16, as compared to 65 for normal adjacent hepatic parenchyma. There are no other hepatic abnormalities. The gallbladder is normal by CT. Spleen is normal. Pancreas is normal. Adrenal glands and kidneys are normal. There is atherosclerotic calcification of the aorta. The proximal iliac arteries are visualized with no enhancement of the proximal left iliac arteries. There is no ascites. No significant adenopathy. The visualized portions of the lung bases are clear. There are no acute musculoskeletal findings.  IMPRESSION: 1. Perceived mass seen on ultrasound performed earlier today represents focal fatty infiltration  2.  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  Critical Value/emergent results were called by telephone at the time of interpretation on 02/17/2013 at 3:54 PM to Bosie Clos, the patient's nurse, who verbally acknowledged these results.   Electronically Signed   By: Esperanza Heir M.D.   On: 02/17/2013 15:54    Review of Systems  Constitutional: Negative.   Eyes: Negative.   Respiratory: Negative.   Cardiovascular: Negative.   Gastrointestinal: Negative.   Genitourinary: Negative.   Musculoskeletal: Negative.   Skin:       Rash from falling on floor/carpet at home. Site on back started about 2 weeks ago.  Neurological: Negative.   Endo/Heme/Allergies: Negative.   Psychiatric/Behavioral: Negative.    Blood pressure 133/74, pulse 74, temperature 98.5 F (36.9 C), temperature source Oral, resp. rate 18, height 5\' 9"  (1.753 m), weight 66.3 kg (146 lb 2.6 oz), SpO2 98.00%. Physical Exam  Constitutional: He is oriented to person, place, and time. No distress.  Thin BM NAD ALERT AND COOPERATIVE  HENT:  Head:  Normocephalic and atraumatic.  Nose: Nose normal.  HE HAS LARGE  ABRASION FORE HEAD FROM FALLING ON CARPET AT HOME.  Eyes: Conjunctivae are normal. Pupils are equal, round, and reactive to light. Right eye exhibits no discharge. Left eye exhibits no discharge. No scleral icterus.  Neck: Normal range of motion. Neck supple. No JVD present. No tracheal deviation present. No thyromegaly present.  Cardiovascular: Normal rate, regular rhythm, normal heart sounds and intact distal pulses.  Exam reveals no gallop.   No murmur heard. DECREASED PULSE IN LLE  Respiratory: Effort normal and breath sounds normal. No respiratory distress. He has no wheezes. He has no rales. He exhibits no tenderness.  GI: Soft. Bowel sounds are normal. He exhibits no distension and no mass. There is no tenderness. There is no rebound and no guarding.  Musculoskeletal: He exhibits edema (SOME EDEMA (TRACE)).  Lymphadenopathy:    He has no cervical adenopathy.  Neurological: He is alert and oriented to person, place, and time. No cranial nerve deficit.  Skin: He is not diaphoretic.     Draining site right back/shoulder about 4 cm long draining some.  Very superficial. "rugburn" where he states  fell at home and carpet was against his forehead 4X1.5X.1cm has evolved into partial thickness skin loss.  Area 100% red and moist.  No odor or drainage.     Right knee with intact blister 1X1cm.  This occurred when pt was crawling on rug, he states.     Psychiatric: He has a normal mood and affect. His behavior is normal. Judgment and thought content normal.    Assessment/Plan: 1. Right back superficial abscess 2.  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  Transaminitis with hx of ETOH use 4. Hx of tobacco use 5. Hypokalemia   Plan:  I think we can I&D this site at the bedside.  I have ordered supplies and they should call when it is ready.      Aleya Durnell 02/19/2013, 12:53 PM

## 2013-02-19 NOTE — Progress Notes (Signed)
CSW (Clinical Child psychotherapist) spoke with pt to inform that at this time there are no bed offers. CSW spoke with pt about possibly staying with family after discharge. Pt was accepting of this idea but unsure if family would be. Pt asked CSW to please call his sister to speak about this. CSW called and left voicemail for pt sister, Renato Gails.  Anaisha Mago, LCSWA [phone]

## 2013-02-19 NOTE — Progress Notes (Signed)
Patient ID: Benjamin [name]  male  ZOX:[id number]    DOB: 05-17-1950    DOA: 02/16/2013  PCP: Dartha Lodge, FNP  Interval hx; Patient is a 62 year old male who was admitted with left leg weakness and failure to thrive. Patient was brought to the ER by his sister as he was having trouble with walking. Patient had a sudden onset of aphasia in December 2014 and trouble walking but he did not seek any medical attention. Prior to admission, he was feeling weaker and was not able to walk, was crawling around in the house and experienced a rug burn on his head.Evaluation in the ER included a negative head CT, WBC of 22K, Hb 13g/dL, and elevation of LFTs. MRI of the brain was positive for acute nonhemorrhagic infarct in the right caudate head, patient underwent full stroke workup. Patient was found to have UTI, started on IV Rocephin however urine culture showed only 30,000 colonies [name] also reported of a mass yesterday 10/27, on his back for last 3-4 weeks, initially felt to be soft, nontender. Per surgery, to be addressed outpatient however this morning, the patient had draining out of that mass with a cellulitic area around the skin.   Consults  Neurology, Dr. Thad Ranger General surgery  Assessment/Plan: Principal Problem: Acute CVA with Left-sided weakness superimposed on generalized debility: The patient is a poor historian, states that left sided weakness for last 1 week, has been using a walker for few months, per admitting H&P, sudden onset of aphasia in December 2014 with trouble walking but did not seek medical care. A day before the admission, he was not able to walk and was crawling in the house and his head was scraped on the rug -CT head showed atrophic in chronic ischemic white matter disease without acute intracranial abnormality. Small lacunar infarct in the anterior limb of the right internal capsule likely chronic. - MRI of the brain showed acute nonhemorrhagic infarct in the  right caudate head, punctate diffusion abnormality and posterior left frontal lobe, diffuse atrophy, multiple bilateral remote lacunar infarcts of the basal ganglia and cerebellum bilaterally - MRA showed mild distal small vessel disease - 2-D echo showed EF of 6-70%, no patent foramina ovale - Carotid Dopplers showed bilateral 1-39% ICA stenosis - PT OT evaluation recommended skilled nursing facility - Lipid panel shows cholesterol 145, LDL 103. Will not place on statin due to acute transaminitis  - Hemoglobin A1c 5.5 -Continue aspirin 325 mg daily   Active Problems: Cellulitis with abscess/wound on the back: - Per patient has been there for last 3-4 weeks - Today, the wound has opened and is draining, with cellulitic area around it. Will need I&D, cultures and wound packing, surgery reconsulted today - For now I have placed him on IV vancomycin, should be okay on oral antibiotics upon DC ?doxy or keflex  Transaminitis: States that he has quit drinking alcohol - RUQ US shows 2.3 cm x2.6 cm echogenic area in left lobe of the liver,  - CT of the abdomen showed focal fatty infiltration, occlusion of the left iliac artery, no symptoms of acute peripheral vascular disease will need outpatient vascular surgery work-up.     Leukocytosis: Resolved, Likely due to UTI or possibly due to the back wound. WBC was 22.0 at the time of admission, he was placed on IV Rocephin for possible UTI. Urine culture showed only 30,000 colonies, DC IV Rocephin    Failure to thrive in adult, appears to have some dementia as well -  Will need a skilled nursing facility   abrasions of the head and forehead, bilateral LE   -wound care consult  DVT Prophylaxis: heparin subcutaneous   Code Status:  Disposition: Skilled nursing facility    Subjective: Patient seen and examined, back wound redressed again, now is draining, with cellulitic area No fevers or chills   Objective: Weight change:   Intake/Output  Summary (Last 24 hours) at 02/19/13 1208 Last data filed at 02/19/13 0900  Gross per 24 hour  Intake    720 ml  Output   1050 ml  Net   -330 ml   Blood pressure 133/74, pulse 74, temperature 98.5 F (36.9 C), temperature source Oral, resp. rate 18, height 5\' 9"  (1.753 m), weight 66.3 kg (146 lb 2.6 oz), SpO2 98.00%.  Physical Exam: General: Alert and awake, oriented x3, not in any acute distress. Abrasions on forehead looks much better  CVS: S1-S2 clear,  Chest: CTAB Abdomen: soft nontender, nondistended, normal bowel sounds  Extremities: no c/c/e bilaterally, no draining wounds on the lower extremities  Neuro: Cranial nerves II-XII intact, LUE and LLE 4/5, RUE and RLE 5/5 Back; draining wound  Lab Results: Basic Metabolic Panel:  Recent Labs Lab 02/16/13 2022 02/16/13 2101 02/17/13 0440 02/18/13 0514  NA 136 141  --  135  K 3.8 3.8  --  3.0*  CL 98 103  --  100  CO2 25  --   --  27  GLUCOSE 93 92  --  95  BUN 30* 30*  --  14  CREATININE 0.96 1.20 0.88 0.79  CALCIUM 9.2  --   --  8.6   Liver Function Tests:  Recent Labs Lab 02/17/13 0440 02/18/13 0514  AST 208* 213*  ALT 69* 94*  ALKPHOS 145* 161*  BILITOT 0.8 1.0  PROT 6.9 6.1  ALBUMIN 2.8* 2.4*   No results found for this basename: LIPASE, AMYLASE,  in the last 168 hours No results found for this basename: AMMONIA,  in the last 168 hours CBC:  Recent Labs Lab 02/16/13 2022  02/17/13 0440 02/18/13 0514  WBC 22.0*  --  17.8* 9.5  NEUTROABS 19.9*  --   --   --   HGB 12.3*  < > 11.3* 10.7*  HCT 36.3*  < > 33.3* 31.8*  MCV 89.4  --  90.5 90.3  PLT 148*  --  147* 153  < > = values in this interval not displayed. Cardiac Enzymes:  Recent Labs Lab 02/16/13 2022  TROPONINI <0.30   BNP: No components found with this basename: POCBNP,  CBG:  Recent Labs Lab 02/16/13 2103 02/17/13 0753 02/17/13 1642 02/18/13 2105  GLUCAP 92 87 127* 108*     Micro Results: Recent Results (from the past 240  hour(s))  URINE CULTURE     Status: None   Collection Time    02/16/13 10:07 PM      Result Value Range Status   Specimen Description URINE, RANDOM   Final   Special Requests NONE   Final   Culture  Setup Time     Final   Value: 02/17/2013 05:59     Performed at [facility] Count     Final   Value: 30,000 COLONIES/ML     Performed at [facility]   Culture     Final   Value: Multiple bacterial morphotypes present, none predominant. Suggest appropriate recollection if clinically indicated.     Performed at [facility]   Report Status 02/18/2013 FINAL   Final    Studies/Results: Dg Chest 2 View  02/16/2013   CLINICAL DATA:  Left-sided weakness.  EXAM: CHEST  2 VIEW  COMPARISON:  None.  FINDINGS: Cardiopericardial silhouette within normal limits. Mediastinal contours normal. Trachea midline. No airspace disease or effusion. Monitoring leads project over the chest.  IMPRESSION: No active cardiopulmonary disease.   Electronically Signed   By: Andreas Newport M.D.   On: 02/16/2013 21:46   Ct Head Wo Contrast  02/16/2013   CLINICAL DATA:  Weakness. History of stroke.  EXAM: CT HEAD WITHOUT CONTRAST  TECHNIQUE: Contiguous axial images were obtained from the base of the skull through the vertex without intravenous contrast.  COMPARISON:  None.  FINDINGS: No mass lesion, mass effect, midline shift, hydrocephalus, hemorrhage. No acute territorial cortical ischemia/infarct. Atrophy and chronic ischemic white matter disease is present. Small lacunar infarct is present in the anterior limb of the right internal capsule, likely chronic. Calvarium intact. Left maxillary mucosal thickening. Question prior left maxillary antrectomy. Intracranial atherosclerosis.  IMPRESSION: Atrophy and chronic ischemic white matter disease without acute intracranial abnormality.   Electronically Signed   By: Andreas Newport M.D.   On: 02/16/2013 20:45   US Abdomen Complete  02/17/2013    CLINICAL DATA:  Elevated LFTs  EXAM: ULTRASOUND ABDOMEN COMPLETE  COMPARISON:  None.  FINDINGS: Gallbladder  Gallbladder sludge is noted. No shadowing gallstones. No thickening of gallbladder wall. No sonographic Murphy's sign.  Common bile duct  Diameter: There is CBD dilatation measuring 11 mm in diameter.  Liver  There liver is heterogeneous. There is echogenic area in left hepatic lobe measures 2.6 x 2.3 cm. Statistically this may represent a hemangioma. Confirmation with enhanced CT or MRI could be performed. No intrahepatic biliary ductal dilatation.  IVC  No abnormality visualized.  Pancreas  No focal pancreatic abnormality. Main pancreatic duct measures 2.9 mm in diameter.  Spleen  Size and appearance within normal limits.  Measures 6 cm in length.  Right Kidney  Length: 10.9 cm. Echogenicity within normal limits. No mass or hydronephrosis visualized.  Left Kidney  Length: 10 cm. Echogenicity within normal limits. No mass or hydronephrosis visualized.  Abdominal aorta  No aneurysm visualized.  Measures up to 2.5 cm in diameter.  IMPRESSION: 1. No shadowing gallstones are noted within gallbladder. Gallbladder sludge is noted. 2. CBD dilatation measuring 1.1 cm in diameter. 3. Heterogeneous liver without biliary ductal dilatation. Question hemangioma in left hepatic lobe measures 2.6 x 2.3 cm. Further evaluation with enhanced CT or MRI could be performed. 4. No hydronephrosis or hydroureter.   Electronically Signed   By: Natasha Mead M.D.   On: 02/17/2013 09:46    Medications: Scheduled Meds: . aspirin  325 mg Oral Daily  . cefTRIAXone (ROCEPHIN)  IV  1 g Intravenous Q24H  . heparin  5,000 Units Subcutaneous Q8H  . influenza vac split quadrivalent PF  0.5 mL Intramuscular Tomorrow-1000  . vancomycin  1,000 mg Intravenous Q12H  . vitamin B-12  1,000 mcg Oral Daily      LOS: 3 days   Marvia Troost M.D. [facility] 02/19/2013, 12:08 PM Pager: [phone]  If 7PM-7AM, please contact  night-coverage [email or web address] Password [email or web address]

## 2013-02-19 NOTE — Progress Notes (Signed)
Physical Therapy Treatment Patient Details Name: Benjamin [name] MRN: [id number] DOB: 1950/12/24 Today's Date: 02/19/2013 Time: 8469-6295 PT Time Calculation (min): 23 min  PT Assessment / Plan / Recommendation  History of Present Illness Patient is a 62 yo male admitted with weakness, FTT.  Sister found patient on floor (patient unable to walk) and brought to ED.  Per chart, possible CVA in December 2013 - no medical attention at that time.     PT Comments   Progressing, able to ambulate today with moderate assist however is quite unstable and has very poor mechanics (see below). Pt will need 24 hour assist and reports he does not have enough family support at this time. Continue to recommend SNF, will continue to follow.   Follow Up Recommendations  SNF;Supervision/Assistance - 24 hour        Barriers to Discharge  Decreased caregiver support      Equipment Recommendations  Rolling walker with 5" wheels       Frequency Min 3X/week   Progress towards PT Goals Progress towards PT goals: Progressing toward goals  Plan Current plan remains appropriate    Precautions / Restrictions Precautions Precautions: Fall Precaution Comments: Lt sided weakness Restrictions Weight Bearing Restrictions: No   Pertinent Vitals/Pain No c/o    Mobility  Bed Mobility Bed Mobility: Supine to Sit;Sitting - Scoot to [place] of Bed;Sit to Supine;Scooting to [facility] Rolling Left: 3: Mod assist;With rail Left Sidelying to Sit: With rails;HOB elevated;3: Mod assist Sitting - Scoot to Edge of Bed: With rail;Other (comment);3: Mod assist (w/ pad assist) Details for Bed Mobility Assistance: Verbal & tactile cues for safety, technique and hand placement. LE assist & vc's for trunk/weight shifting. Transfers Transfers: Sit to Stand;Stand to Sit Sit to Stand: 4: Min assist;With upper extremity assist;From bed;3: Mod assist Stand to Sit: 4: Min assist;With upper extremity assist;To chair/3-in-1 Details for  Transfer Assistance: verbal cues for safety and sequencing, facilitation of anterior trunk translation and anterior pelvic tilt for ease of transition into standing.  Ambulation/Gait Ambulation/Gait Assistance: 4: Min assist;3: Mod assist Ambulation Distance (Feet): 20 Feet Assistive device: Rolling walker (chair to follow) Ambulation/Gait Assistance Details: mod assist initially progressing to min assist. Cues for correction of bil. LE placement and mechanics (internal rotation of Lt.) squaring of pelvic (Lt. retraction, Rt. trendelenburg) and wide base of support.  Gait Pattern: Trunk flexed;Wide base of support;Decreased weight shift to right;Trunk rotated posteriorly on left;Trendelenburg;Shuffle;Decreased stance time - right (Lt. external rotation) Stairs: No Modified Rankin (Stroke Patients Only) Pre-Morbid Rankin Score: Slight disability Modified Rankin: Moderately severe disability      PT Goals (current goals can now be found in the care plan section)    Visit Information  Last PT Received On: 02/19/13 Assistance Needed: +1 History of Present Illness: Patient is a 62 yo male admitted with weakness, FTT.  Sister found patient on floor (patient unable to walk) and brought to ED.  Per chart, possible CVA in December 2013 - no medical attention at that time.      Subjective Data   yeah i'm ready to get up   Cognition  Cognition Arousal/Alertness: Awake/alert Behavior During Therapy: Flat affect Overall Cognitive Status: No family/caregiver present to determine baseline cognitive functioning    Balance  Balance Balance Assessed: Yes Static Sitting Balance Static Sitting - Balance Support: Bilateral upper extremity supported;Feet supported Static Sitting - Level of Assistance: 5: Stand by assistance Static Standing Balance Static Standing - Balance Support: Bilateral upper extremity supported  Static Standing - Level of Assistance: 4: Min assist;5: Stand by assistance  End  of Session PT - End of Session Activity Tolerance: Patient limited by fatigue Patient left: with call bell/phone within reach;in chair (camera room) Nurse Communication: Mobility status   GP     Wilhemina Bonito 02/19/2013, 11:58 AM

## 2013-02-19 NOTE — Progress Notes (Signed)
CSW (Clinical Child psychotherapist) received call from [facility] extending a bed offer to pt. CSW left voicemail for pt sister to notify and relayed bed offer to pt.  Anjolina Byrer, LCSWA [phone]

## 2013-02-20 DIAGNOSIS — I739 Peripheral vascular disease, unspecified: Secondary | ICD-10-CM

## 2013-02-20 LAB — CBC
HCT: 31.1 % — ABNORMAL LOW (ref 39.0–52.0)
Hemoglobin: 10.6 g/dL — ABNORMAL LOW (ref 13.0–17.0)
MCV: 90.9 fL (ref 78.0–100.0)
RBC: 3.42 MIL/uL — ABNORMAL LOW (ref 4.22–5.81)
WBC: 7.2 10*3/uL (ref 4.0–10.5)

## 2013-02-20 LAB — BASIC METABOLIC PANEL
BUN: 10 mg/dL (ref 6–23)
CO2: 26 mEq/L (ref 19–32)
Chloride: 101 mEq/L (ref 96–112)
GFR calc Af Amer: >90 mL/min (ref >90)
GFR calc non Af Amer: >90 mL/min (ref >90)
Glucose, Bld: 96 mg/dL (ref 70–99)
Potassium: 3.1 mEq/L — ABNORMAL LOW (ref 3.5–5.1)
Sodium: 136 mEq/L (ref 135–145)

## 2013-02-20 LAB — GLUCOSE, CAPILLARY: Glucose-Capillary: 88 mg/dL (ref 70–99)

## 2013-02-20 MED ORDER — ASPIRIN 325 MG PO TABS: 325.0000 mg | ORAL_TABLET | Freq: Every day | ORAL | Status: DC

## 2013-02-20 MED ORDER — CYANOCOBALAMIN 1000 MCG PO TABS: 1000.0000 ug | ORAL_TABLET | Freq: Every day | ORAL | Status: DC

## 2013-02-20 MED ORDER — POTASSIUM CHLORIDE CRYS ER 20 MEQ PO TBCR
40.0000 meq | EXTENDED_RELEASE_TABLET | ORAL | Status: AC
  Administered 2013-02-20 (×2): 40 meq via ORAL
  Filled 2013-02-20 (×2): qty 2

## 2013-02-20 MED ORDER — DOXYCYCLINE HYCLATE 50 MG PO CAPS: 100.0000 mg | ORAL_CAPSULE | Freq: Two times a day (BID) | ORAL | Status: DC

## 2013-02-20 NOTE — Progress Notes (Signed)
CSW Proofreader) spoke with pt sister and informed of discharge to [facility] in [place] today. CSW left message for facility. Will facilitate dc after confirmation call received from facility.  Mikisha Roseland, LCSWA [phone]

## 2013-02-20 NOTE — Consult Note (Signed)
[facility] NOTE   MRN : [id number]  Reason for Consult: Possible Left Iliac stenosis Referring Physician: Cathren Harsh, MD  History of Present Illness: Benjamin [name] is a 62 y.o. 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 The pt had a CVA in 2010 and has difficulty walking. He presently denies claudication, rest pain in the left leg or nonhealing ulcers on his feet. He does not ambulate very far since his CVA.  He has had left leg claudication prior to evaluation in 2005. ABI's as below. He had an aortogram which showed left Iliac Occlusive disease.   On 04/12/2004 - pt had a right to left fem-fem bypass by Dr Hart [place].  2005 LE Art duplex Findings compatible with significant inflow disease in the left lower extremity, possibly within the iliac or common femoral arteries. The ABI is significantly diminished at rest, diminishing further after exercise. The right ankle-brachial index is 1.09 at rest, dropping slightly to 0.94 following five minutes of exercise. The left ABI is significantly diminished at 0.52 prior to exercise, then drops significantly to 0.20 after exercise.        Current Facility-Administered Medications  Medication Dose Route Frequency Provider Last Rate Last Dose  . 0.9 %  sodium chloride infusion   Intravenous Continuous Ripudeep K Rai, MD      . aspirin tablet 325 mg  325 mg Oral Daily Houston Siren, MD   325 mg at 02/19/13 1434  . heparin injection 5,000 Units  5,000 Units Subcutaneous Q8H Sherrie George, PA-C   5,000 Units at 02/20/13 0604  . hydrocerin (EUCERIN) cream   Topical Daily Ripudeep K Rai, MD      . influenza vac split quadrivalent PF (FLUARIX) injection 0.5 mL  0.5 mL Intramuscular Tomorrow-1000 Ripudeep K Rai, MD      . lidocaine-EPINEPHrine (XYLOCAINE W/EPI) 2 %-1:100000 (with pres) injection 20 mL  20 mL Infiltration Once Sherrie George, PA-C      . neomycin-bacitracin-polymyxin (NEOSPORIN) ointment   Topical Daily Ripudeep K Rai, MD      . potassium chloride SA (K-DUR,KLOR-CON) CR tablet 40 mEq  40 mEq Oral Q4H Belkys A Regalado, MD      . vancomycin (VANCOCIN) IVPB 1000 mg/200 mL premix  1,000 mg Intravenous Q12H Fredrik Rigger, RPH   1,000 mg at 02/20/13 0121  . vitamin B-12 (CYANOCOBALAMIN) tablet 1,000 mcg  1,000 mcg Oral Daily Ripudeep K Rai, MD   1,000 mcg at 02/19/13 1435    Pt meds include: Statin :No Betablocker: No ASA: No Other anticoagulants/antiplatelets: none  Past Medical History  Diagnosis Date  . Stroke     Past Surgical History  Procedure Laterality Date  . Other surgical history      Stent, unknown    Social History History  Substance Use Topics  . Smoking status: Former Smoker -- .5 years  . Smokeless tobacco: Never Used  . Alcohol Use: No    No Known Allergies   REVIEW OF SYSTEMS  General: [ ]  Weight loss, [ ]  Fever, [ ]  chills Neurologic: [ ]  Dizziness, [ ]  Blackouts, [ ]  Seizure [x ] Stroke, [ ]  "Mini stroke", [ ]  Slurred speech, [ ]  Temporary blindness; [x ] weakness in arms or legs, [ ]  Hoarseness [ ]  Dysphagia Cardiac: [ ]  Chest pain/pressure, [ ]  Shortness of breath at rest [ ]  Shortness of breath with  exertion, [ ]  Atrial fibrillation or irregular heartbeat  Vascular: [ ]  Pain in legs with walking, [ ]  Pain in legs at rest, [ ]  Pain in legs at night,  [ ]  Non-healing ulcer, [ ]  Blood clot in vein/DVT,   Pulmonary: [ ]  Home oxygen, [ ]  Productive cough, [ ]  Coughing up blood, [ ]  Asthma,  [ ]  Wheezing [ ]  COPD Musculoskeletal:  [ ]  Arthritis, [ x] Low back pain, [ ]  Joint pain Hematologic: [ ]  Easy Bruising, [ ]  Anemia; [ ]  Hepatitis Gastrointestinal: [ ]  Blood in stool, [ ]  Gastroesophageal Reflux/heartburn, Urinary: [ ]  chronic Kidney disease, [ ]  on HD - [ ]  MWF or [ ]  TTHS, [ ]  Burning with urination, [ ]  Difficulty urinating Skin: [ ]  Rashes, [ ]   Wounds Psychological: [ ]  Anxiety, [ ]  Depression  Physical Examination Filed Vitals:   02/19/13 2000 02/20/13 0000 02/20/13 0409 02/20/13 0726  BP: 126/74 142/89 137/75 139/73  Pulse: 75 75 80 78  Temp: 98.3 F (36.8 C)  98.2 F (36.8 C) 98.8 F (37.1 C)  TempSrc: Oral   Oral  Resp:   18 17  Height:      Weight:      SpO2: 99% 100% 96% 98%   Body mass index is 21.57 kg/(m^2).  General:  WDWN in NAD Gait: uses walker - min ambulation HENT: WNL x large abrasion on forehead Eyes: Pupils equal Pulmonary: normal non-labored breathing , without Rales, rhonchi,  wheezing Cardiac: RRR, without  Murmurs, rubs or gallops; Abdomen: soft, NT, no masses Skin: no rashes, ulcers noted;  no Gangrene , no cellulitis; no open wounds;  scaly skin over feet  Vascular Exam/Pulses: 3+ bilateral Femoral pulses 2+ DP bilat 2+ PT on right Musculoskeletal: no muscle wasting   Neurologic: A&O X 3; Appropriate Affect ;  SENSATION: normal; MOTOR FUNCTION: 5/5 Symmetric Speech is fluent, slightly slurred and slow   Significant Diagnostic Studies: CBC Lab Results  Component Value Date   WBC 7.2 02/20/2013   HGB 10.6* 02/20/2013   HCT 31.1* 02/20/2013   MCV 90.9 02/20/2013   PLT 185 02/20/2013    BMET    Component Value Date/Time   NA 136 02/20/2013 0503   K 3.1* 02/20/2013 0503   CL 101 02/20/2013 0503   CO2 26 02/20/2013 0503   GLUCOSE 96 02/20/2013 0503   BUN 10 02/20/2013 0503   CREATININE 0.84 02/20/2013 0503   CALCIUM 9.1 02/20/2013 0503   GFRNONAA >90 02/20/2013 0503   GFRAA >90 02/20/2013 0503   Estimated Creatinine Clearance: 87.7 ml/min (by C-G formula based on Cr of 0.84).  COAG Lab Results  Component Value Date   INR 1.12 02/16/2013     Non-Invasive Vascular Imaging:  Carotid Dopplers showed bilateral 1-39% ICA stenosis  ASSESSMENT/PLAN: Benjamin [name] is a 62 y.o. male with known Left iliac occlusive disease and Fem-Fem bypass in 2005 which is patent. He  has palpable femoral and DP pulses bilaterally with no C/O claudication in LLE No vascular intervention or testing at this time. Pt should F/U in our office if he has pain, claudication or non-healing ulcers on the left.  Benjamin [name] J 02/20/2013 10:35 AM

## 2013-02-20 NOTE — Progress Notes (Signed)
CSW (Clinical Child psychotherapist) prepared pt dc packet and placed with shadow chart. CSW has arranged for non emergent ambulance pick up at 2:30pm. Pt, pt family, facility, and nurse aware. CSW signing off.  Jen Eppinger, LCSWA [phone]

## 2013-02-20 NOTE — Progress Notes (Signed)
Wilmon Arms. Corliss Skains, MD, [facility] Surgery  General/ Trauma Surgery  02/20/2013 9:17 AM

## 2013-02-20 NOTE — Progress Notes (Signed)
Occupational Therapy Treatment Patient Details Name: Benjamin [name] MRN: [id number] DOB: October 18, 1950 Today's Date: 02/20/2013 Time: 4098-1191 OT Time Calculation (min): 32 min  OT Assessment / Plan / Recommendation  History of present illness Patient is a 62 yo male admitted with weakness, FTT.  Sister found patient on floor (patient unable to walk) and brought to ED.  Per chart, possible CVA in December 2013 - no medical attention at that time.     OT comments  Pt progressing toward acute OT goals related to ADL's & functional transfers. Balance has improved since last session. D/C plan remains appropriate.  Follow Up Recommendations  SNF    Barriers to Discharge       Equipment Recommendations  None recommended by OT    Recommendations for Other Services    Frequency Min 2X/week   Progress towards OT Goals Progress towards OT goals: Progressing toward goals  Plan Discharge plan remains appropriate    Precautions / Restrictions Precautions Precautions: Fall Precaution Comments: Lt sided weakness Restrictions Weight Bearing Restrictions: No   Pertinent Vitals/Pain No c/o, denies pain.    ADL  Eating/Feeding: Performed;Set up Where Assessed - Eating/Feeding: Bed level Grooming: Performed;Wash/dry hands;Wash/dry face;Teeth care;Modified independent Where Assessed - Grooming: Unsupported sitting Upper Body Bathing: Performed;Chest;Right arm;Left arm;Abdomen;Modified independent Where Assessed - Upper Body Bathing: Unsupported sitting Lower Body Bathing: Performed;Moderate assistance Where Assessed - Lower Body Bathing: Supported sit to stand Upper Body Dressing: Performed;Min guard Where Assessed - Upper Body Dressing: Unsupported sitting Lower Body Dressing: Performed;Moderate assistance Where Assessed - Lower Body Dressing: Supported sit to Pharmacist, hospital: Performed;Minimal Dentist Method: Surveyor, minerals: Holiday representative and Hygiene: Performed;Minimal assistance Where Assessed - Engineer, mining and Hygiene: Sit to stand from 3-in-1 or toilet Tub/Shower Transfer Method: Not assessed Equipment Used: Rolling walker;Other (comment) (3:1) Transfers/Ambulation Related to ADLs: Min A sit to stand from bed, w/ bed height slightly elevated, UE assist. Pt performed SPT to 3:1 w/ min asssist & vc's for safety and hand placement ADL Comments: Pt participated in ADL's today EOB and toileting transfers. Pt progressing, needing less assist overall. Sitting balance has improved. Cont w/ acute OT goals/POC.    OT Diagnosis:    OT Problem List:   OT Treatment Interventions:     OT Goals(current goals can now be found in the care plan section)    Visit Information  Last OT Received On: 02/20/13 Assistance Needed: +1 History of Present Illness: Patient is a 62 yo male admitted with weakness, FTT.  Sister found patient on floor (patient unable to walk) and brought to ED.  Per chart, possible CVA in December 2013 - no medical attention at that time.      Subjective Data      Prior Functioning       Cognition  Cognition Arousal/Alertness: Awake/alert Behavior During Therapy: Flat affect Overall Cognitive Status: Within Functional Limits for tasks assessed    Mobility  Bed Mobility Bed Mobility: Supine to Sit;Sitting - Scoot to Edge of Bed Supine to Sit: 4: Min assist;With rails;HOB elevated Sitting - Scoot to Edge of Bed: 3: Mod assist;With rail Details for Bed Mobility Assistance: Verbal & tactile cues for safety, technique and hand placement. LE assist & vc's for trunk/weight shifting. Transfers Transfers: Sit to Stand;Stand to Sit Sit to Stand: 4: Min assist;From bed;From chair/3-in-1;With upper extremity assist Stand to Sit: 4: Min assist;To chair/3-in-1;With upper extremity assist Details for Transfer Assistance: verbal  cues for safety and sequencing,  facilitation of anterior trunk translation and anterior pelvic tilt for ease of transition into standing.          Balance Balance Balance Assessed: Yes Static Sitting Balance Static Sitting - Balance Support: No upper extremity supported;Feet supported Static Sitting - Level of Assistance: 6: Modified independent (Device/Increase time) Static Standing Balance Static Standing - Balance Support: Bilateral upper extremity supported Static Standing - Level of Assistance: 5: Stand by assistance;4: Min assist   End of Session OT - End of Session Equipment Utilized During Treatment: Rolling walker Activity Tolerance: Patient tolerated treatment well Patient left: Other (comment);with call bell/phone within reach (Sitting on 3:1, RN staff aware) Nurse Communication: Other (comment) (Pt sitting on 3:1 bedside, RN staff made aware)  GO     Alm Bustard 02/20/2013, 10:44 AM

## 2013-02-20 NOTE — Progress Notes (Signed)
  Subjective: Back feels better, pain only when he "bumps" site.    Objective: Vital signs in last 24 hours: Temp:  [98.2 F (36.8 C)-98.8 F (37.1 C)] 98.8 F (37.1 C) (10/29 0726) Pulse Rate:  [68-80] 78 (10/29 0726) Resp:  [17-18] 17 (10/29 0726) BP: (126-142)/(73-89) 139/73 mmHg (10/29 0726) SpO2:  [96 %-100 %] 98 % (10/29 0726) Last BM Date: 02/18/13  Intake/Output from previous day: 10/28 0701 - 10/29 0700 In: 1760 [P.O.:1560; IV Piggyback:200] Out: 3500 [Urine:3500] Intake/Output this shift: Total I/O In: 240 [P.O.:240] Out: -   Incision/Wound: upper back wound is open minimal purulent output, bloody.   Lab Results:   Recent Labs  02/18/13 0514 02/20/13 0503  WBC 9.5 7.2  HGB 10.7* 10.6*  HCT 31.8* 31.1*  PLT 153 185   BMET  Recent Labs  02/18/13 0514 02/20/13 0503  NA 135 136  K 3.0* 3.1*  CL 100 101  CO2 27 26  GLUCOSE 95 96  BUN 14 10  CREATININE 0.79 0.84  CALCIUM 8.6 9.1    Anti-infectives: Anti-infectives   Start     Dose/Rate Route Frequency Ordered Stop   02/19/13 1300  vancomycin (VANCOCIN) IVPB 1000 mg/200 mL premix     1,000 mg 200 mL/hr over 60 Minutes Intravenous Every 12 hours 02/19/13 1153     02/17/13 0800  cefTRIAXone (ROCEPHIN) 1 g in dextrose 5 % 50 mL IVPB  Status:  Discontinued     1 g 100 mL/hr over 30 Minutes Intravenous Every 24 hours 02/17/13 0715 02/19/13 1217      Assessment/Plan: S/p incision and drainage of infected sebaceous cyst -dressing changed, he tolerated quite well. -BID NS wet to dry dressing changes, start tonight. -may change to PO atbx, keflex is okay -I will schedule a f/u in our clinic in 2-3 weeks for a wound check   LOS: 4 days   Jennifier Smitherman ANP-BC  02/20/2013 9:07 AM

## 2013-02-20 NOTE — Discharge Summary (Addendum)
Physician Discharge Summary  Benjamin [name] ZOX:[id number] DOB: [birth year] DOA: 02/16/2013  PCP: Dartha Lodge, FNP  Admit date: 02/16/2013 Discharge date: 02/20/2013  Time spent: 35 minutes  Recommendations for Outpatient Follow-up:  1. Need to follow up with surgery for further care of back abscess.  2. Needs to follow up with vascular  3. Need follow up liver US, LFT 4. Follow up culture results from abscess.  5. Need repeat B12 level.   Discharge Diagnoses:    Acute CVA    Cellulitis with abscess/wound on the back   Weakness generalized   Left leg weakness   Leukocytosis   Failure to thrive in adult   CVA (cerebral infarction)   B12 deficiency   UTI (urinary tract infection)   Discharge Condition: Stable  Diet recommendation: Heart Healthy  Filed Weights   02/17/13 0130  Weight: 66.3 kg (146 lb 2.6 oz)    History of present illness:  Benjamin [name] is an 62 y.o. male with prior hx of alcohol, lives alone, brought to the ER by his sister as he was having trouble with walking. He was having sudden onset of aphasia in December 2014, and having trouble walking, but never went to be evaluated medically, because of lack of insurance, and being "stubborn" as per his sister. He has been able to get around with his 4 point walker, and reportedly was able to cook for himself. He has been feeling weaker, but yesterday, he was not able to walk, so he crawled around the house, and experienced a "rug burn" on his head. He denied any fall, HA, stiffneck, slurred speech, facial droop, upper extremity weakenss, or any other neurological symptoms. Evaluation in the ER included a negative head CT, WBC of 22K, Hb 13g/dL, and elevation of LFTs. He has no dysuria, coughs, abdominal pain, nausea or vomiting, diarrhea or any other symptomology.   Hospital Course:   Acute CVA with Left-sided weakness superimposed on generalized debility: The patient is a poor historian, states that left sided  weakness for last 1 week, has been using a walker for few months, per admitting H&P, sudden onset of aphasia in December 2014 with trouble walking but did not seek medical care. A day before the admission, he was not able to walk and was crawling in the house and his head was scraped on the rug  -CT head showed atrophic in chronic ischemic white matter disease without acute intracranial abnormality. Small lacunar infarct in the anterior limb of the right internal capsule likely chronic.  - MRI of the brain showed acute nonhemorrhagic infarct in the right caudate head, punctate diffusion abnormality and posterior left frontal lobe, diffuse atrophy, multiple bilateral remote lacunar infarcts of the basal ganglia and cerebellum bilaterally  - MRA showed mild distal small vessel disease  - 2-D echo showed EF of 6-70%, no patent foramina ovale  - Carotid Dopplers showed bilateral 1-39% ICA stenosis  - PT OT evaluation recommended skilled nursing facility  - Lipid panel shows cholesterol 145, LDL 103. Will not place on statin due to acute transaminitis  - Hemoglobin A1c 5.5  -Continue aspirin 325 mg daily   Cellulitis with abscess/wound on the back: - Per patient has been there for last 3-4 weeks  - Today, the wound has opened and is draining, with cellulitic area around it.  -Patient S/P ID ano [id number] - He received IV vancomycin, will transition to oral doxycycline.   Transaminitis: States that he has quit drinking alcohol  - RUQ  US shows 2.3 cm x2.6 cm echogenic area in left lobe of the liver,  - CT of the abdomen showed focal fatty infiltration, occlusion of the left iliac artery, no symptoms of acute peripheral vascular disease will need outpatient vascular surgery work-up.   Leukocytosis: Resolved, Likely due to UTI or possibly due to the back wound. WBC was 22.0 at the time of admission, he was placed on IV Rocephin for possible UTI. 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 Ordered ABI. Dr Hart Rochester will see patient prior to discharge.  abrasions of the head and forehead, bilateral LE  B-12 deficiency; discharge on B 12 supplement.    Procedures:  ABI ; Pending.   Consultations:  General surgery.  Neurology  VAscular  Discharge Exam: Filed Vitals:   02/20/13 1218  BP: 147/80  Pulse: 69  Temp: 98.7 F (37.1 C)  Resp: 18    General: awake , alert in no distress.  Cardiovascular: S 1, S 2 RRR Respiratory: CTA  Discharge Instructions      Discharge Orders   Future Appointments Provider Department Dept Phone   03/12/2013 3:15 PM [facility] [place] [phone]   Future Orders Complete By Expires   Diet - low sodium heart healthy  As directed    Increase activity slowly  As directed        Medication List    STOP taking these medications       furosemide 40 MG tablet  Commonly known as:  LASIX     meloxicam 7.5 MG tablet  Commonly known as:  MOBIC      TAKE these medications       aspirin 325 MG tablet  Take 1 tablet (325 mg total) by mouth daily.     cholecalciferol 1000 UNITS tablet  Commonly known as:  VITAMIN D  Take 1,000 Units by mouth every morning.     cyanocobalamin 1000 MCG tablet  Take 1 tablet (1,000 mcg total) by mouth daily.     doxycycline 50 MG capsule  Commonly known as:  VIBRAMYCIN  Take 2 capsules (100 mg total) by mouth 2 (two) times daily.       No Known Allergies Follow-up Information   Follow up with [facility],MD, MD On 03/12/2013. (appointment time: 3:15, arrive by 2:45PM please)    Specialty:  General Surgery   Contact information:   [address] [place] [address] [phone]       Follow up with Dartha Lodge, FNP.   Specialty:  Nurse Practitioner   Contact information:    [address] [place] [address] [phone] [id number]       Follow up with Josephina Gip, MD In 4 weeks.   Specialty:  Vascular Surgery   Contact information:   [address] [place] [address] [phone]        The results of significant diagnostics from this hospitalization (including imaging, microbiology, ancillary and laboratory) are listed below for reference.    Significant Diagnostic Studies: Dg Chest 2 View  02/16/2013   CLINICAL DATA:  Left-sided weakness.  EXAM: CHEST  2 VIEW  COMPARISON:  None.  FINDINGS: Cardiopericardial silhouette within normal limits. Mediastinal contours normal. Trachea midline. No airspace disease or effusion. Monitoring leads project over the chest.  IMPRESSION: No active cardiopulmonary disease.   Electronically Signed   By: Andreas Newport M.D.   On: 02/16/2013 21:46   Ct Head Wo Contrast  02/16/2013   CLINICAL DATA:  Weakness. History of stroke.  EXAM: CT HEAD WITHOUT CONTRAST  TECHNIQUE: Contiguous axial images were obtained from the base of the skull through the vertex without intravenous contrast.  COMPARISON:  None.  FINDINGS: No mass lesion, mass effect, midline shift, hydrocephalus, hemorrhage. No acute territorial cortical ischemia/infarct. Atrophy and chronic ischemic white matter disease is present. Small lacunar infarct is present in the anterior limb of the right internal capsule, likely chronic. Calvarium intact. Left maxillary mucosal thickening. Question prior left maxillary antrectomy. Intracranial atherosclerosis.  IMPRESSION: Atrophy and chronic ischemic white matter disease without acute intracranial abnormality.   Electronically Signed   By: Andreas Newport M.D.   On: 02/16/2013 20:45   [name] Head Wo Contrast  02/17/2013   CLINICAL DATA:  Left leg weakness. Failure to thrive. CVA.  EXAM: MRI HEAD WITHOUT CONTRAST  MRA HEAD WITHOUT CONTRAST  TECHNIQUE: Multiplanar, multiecho pulse sequences of the brain and surrounding  structures were obtained without intravenous contrast. Angiographic images of the head were obtained using MRA technique without contrast.  COMPARISON:  And CT head without contrast 02/16/2013  FINDINGS: MRI HEAD FINDINGS  An acute nonhemorrhagic infarct of the right caudate head measures 14 mm maximally. A punctate area of diffusion signal is present in the posterior left frontal lobe. This is not clearly evident on the ADC map the may represent a slightly later infarct. Extensive periventricular and subcortical T2 hyperintensities are present bilaterally. Remote lacunar infarcts are present in the basal ganglia bilaterally. Remote lacunar infarcts present in the thalami and posterior progressive remote lacunar infarcts are present in the thalami and cerebellum bilaterally as well.  Flow is present in the major intracranial arteries. The globes and orbits are intact. There is residual opacification in the left maxillary sinus despite a maxillary antrostomy. Mild mucosal thickening is present in the right maxillary sinus and ethmoid air cells. Minimal fluid is present in the right mastoid air cells. No obstructing nasopharyngeal lesion is evident.  MRA HEAD FINDINGS  The internal carotid arteries are within normal limits from the high cervical segments through the ICA termini bilaterally. The right A1 segment is dominant. The M1 segments are within normal limits. No significant anterior communicating artery is present. There is mild narrowing of the proximal left A1 segment. Mild distal MCA branch vessel attenuation is present bilaterally. There is no significant proximal stenosis or occlusion.  The left vertebral artery is slightly dominant to the right. The PICA origins are not visualized. The basilar artery is within normal limits. Both posterior cerebral arteries originate from the basilar tip. There is mild attenuation of distal PCA branch vessels.  IMPRESSION: MRI HEAD IMPRESSION  1. Acute nonhemorrhagic  infarct involving the right caudate head measures 14 mm maximally. 2. Punctate diffusion abnormality in the posterior left frontal lobe may represent a resolving white matter infarct. 3. Diffuse atrophy and extensive white matter disease, suggesting advanced microvascular ischemic changes. 4. Multiple bilateral remote lacunar infarcts of the basal ganglia and cerebellum bilaterally.  MRA HEAD IMPRESSION  1. Mild distal small vessel disease. 2. No significant proximal stenosis, aneurysm, or branch vessel occlusion.   Electronically Signed   By: Gennette Pac M.D.   On: 02/17/2013 14:34   US Abdomen Complete  02/17/2013   CLINICAL DATA:  Elevated LFTs  EXAM: ULTRASOUND ABDOMEN  COMPLETE  COMPARISON:  None.  FINDINGS: Gallbladder  Gallbladder sludge is noted. No shadowing gallstones. No thickening of gallbladder wall. No sonographic Murphy's sign.  Common bile duct  Diameter: There is CBD dilatation measuring 11 mm in diameter.  Liver  There liver is heterogeneous. There is echogenic area in left hepatic lobe measures 2.6 x 2.3 cm. Statistically this may represent a hemangioma. Confirmation with enhanced CT or MRI could be performed. No intrahepatic biliary ductal dilatation.  IVC  No abnormality visualized.  Pancreas  No focal pancreatic abnormality. Main pancreatic duct measures 2.9 mm in diameter.  Spleen  Size and appearance within normal limits.  Measures 6 cm in length.  Right Kidney  Length: 10.9 cm. Echogenicity within normal limits. No mass or hydronephrosis visualized.  Left Kidney  Length: 10 cm. Echogenicity within normal limits. No mass or hydronephrosis visualized.  Abdominal aorta  No aneurysm visualized.  Measures up to 2.5 cm in diameter.  IMPRESSION: 1. No shadowing gallstones are noted within gallbladder. Gallbladder sludge is noted. 2. CBD dilatation measuring 1.1 cm in diameter. 3. Heterogeneous liver without biliary ductal dilatation. Question hemangioma in left hepatic lobe measures 2.6 x 2.3  cm. Further evaluation with enhanced CT or MRI could be performed. 4. No hydronephrosis or hydroureter.   Electronically Signed   By: Natasha Mead M.D.   On: 02/17/2013 09:46   Ct Abd Wo & W Cm  02/17/2013   CLINICAL DATA:  2.6 cm hyperechoic mass seen in the left lobe of the liver on ultrasound performed today  EXAM: CT ABDOMEN WITHOUT AND WITH CONTRAST  TECHNIQUE: Multidetector CT imaging of the abdomen was performed following the standard protocol before and following the bolus administration of intravenous contrast.  CONTRAST:  OMNIPAQUE IOHEXOL 300 MG/ML  SOLN  COMPARISON:  Ultrasound performed earlier today  FINDINGS: Mass in the left lobe of the liver is identified, as on ultrasound, as being located adjacent to the falciform ligament. It shows low attenuation on precontrast and post-contrast images with no evidence of enhancement. In it demonstrates an average post contrast attenuation value of 16, as compared to 65 for normal adjacent hepatic parenchyma. There are no other hepatic abnormalities. The gallbladder is normal by CT. Spleen is normal. Pancreas is normal. Adrenal glands and kidneys are normal. There is atherosclerotic calcification of the aorta. The proximal iliac arteries are visualized with no enhancement of the proximal left iliac arteries. There is no ascites. No significant adenopathy. The visualized portions of the lung bases are clear. There are no acute musculoskeletal findings.  IMPRESSION: 1. Perceived mass seen on ultrasound performed earlier today represents focal fatty infiltration  2.  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  Critical Value/emergent results were called by telephone at the time of interpretation on 02/17/2013 at 3:54 PM to Bosie Clos, the patient's nurse, who verbally acknowledged these results.   Electronically Signed   By: Esperanza Heir M.D.   On: 02/17/2013 15:54    Microbiology: Recent Results (from the past 240 hour(s))  URINE CULTURE      Status: None   Collection Time    02/16/13 10:07 PM      Result Value Range Status   Specimen Description URINE, RANDOM   Final   Special Requests NONE   Final   Culture  Setup Time     Final   Value: 02/17/2013 05:59     Performed at [facility] Count  Final   Value: 30,000 COLONIES/ML     Performed at [facility]   Culture     Final   Value: Multiple bacterial morphotypes present, none predominant. Suggest appropriate recollection if clinically indicated.     Performed at [facility]   Report Status 02/18/2013 FINAL   Final     Labs: Basic Metabolic Panel:  Recent Labs Lab 02/16/13 2022 02/16/13 2101 02/17/13 0440 02/18/13 0514 02/20/13 0503  NA 136 141  --  135 136  K 3.8 3.8  --  3.0* 3.1*  CL 98 103  --  100 101  CO2 25  --   --  27 26  GLUCOSE 93 92  --  95 96  BUN 30* 30*  --  14 10  CREATININE 0.96 1.20 0.88 0.79 0.84  CALCIUM 9.2  --   --  8.6 9.1   Liver Function Tests:  Recent Labs Lab 02/16/13 2022 02/17/13 0440 02/18/13 0514  AST 226* 208* 213*  ALT 74* 69* 94*  ALKPHOS 171* 145* 161*  BILITOT 0.9 0.8 1.0  PROT 7.2 6.9 6.1  ALBUMIN 2.9* 2.8* 2.4*   No results found for this basename: LIPASE, AMYLASE,  in the last 168 hours No results found for this basename: AMMONIA,  in the last 168 hours CBC:  Recent Labs Lab 02/16/13 2022 02/16/13 2101 02/17/13 0440 02/18/13 0514 02/20/13 0503  WBC 22.0*  --  17.8* 9.5 7.2  NEUTROABS 19.9*  --   --   --   --   HGB 12.3* 13.6 11.3* 10.7* 10.6*  HCT 36.3* 40.0 33.3* 31.8* 31.1*  MCV 89.4  --  90.5 90.3 90.9  PLT 148*  --  147* 153 185   Cardiac Enzymes:  Recent Labs Lab 02/16/13 2022  TROPONINI <0.30   BNP: BNP (last 3 results) No results found for this basename: PROBNP,  in the last 8760 hours CBG:  Recent Labs Lab 02/17/13 0753 02/17/13 1642 02/18/13 2105 02/20/13 0808 02/20/13 1209  GLUCAP 87 127* 108* 127* 88        Signed:  Luticia Tadros  [facility] 02/20/2013, 1:21 PM

## 2013-02-20 NOTE — Progress Notes (Signed)
VASCULAR LAB PRELIMINARY  ARTERIAL  ABI completed:    RIGHT    LEFT    PRESSURE WAVEFORM  PRESSURE WAVEFORM  BRACHIAL 161 triphasic BRACHIAL 165 triphasic  DP   DP    AT 146 monophasic AT 143 monophasic  PT 144 monophasic PT 139 monophasic  PER   PER    GREAT TOE  NA GREAT TOE  NA    RIGHT LEFT  ABI 0.88 0.87     Maicy Filip, RVT 02/20/2013, 12:23 PM

## 2013-02-21 NOTE — Progress Notes (Signed)
This nurse performed foot care for said patient. Legs and feet washed with soap and warm and water. Warm wet towels wrapped around feet and then wiped dry. Multiple layers of dried skin came off legs and feet. Toenails of left foot were growing into pads of toes. Cleaned toenails out with qtips, foul smelling, brownish black material removed from under toenails of both feet. This nurse checked history and he was non diabetic. Able to cut toenail without issues. Middle toe of left foot has small laceration were toenail had grown into foot. eucerin cream applied and socks replaced. Very appreciative of this nurse helping him with his feet.

## 2013-03-12 ENCOUNTER — Encounter (INDEPENDENT_AMBULATORY_CARE_PROVIDER_SITE_OTHER): Payer: Self-pay

## 2013-03-19 ENCOUNTER — Encounter (INDEPENDENT_AMBULATORY_CARE_PROVIDER_SITE_OTHER): Payer: Self-pay | Admitting: General Surgery

## 2013-03-19 ENCOUNTER — Ambulatory Visit (INDEPENDENT_AMBULATORY_CARE_PROVIDER_SITE_OTHER): Payer: Medicaid Other | Admitting: General Surgery

## 2013-03-19 VITALS — BP 130/68 | HR 58 | Temp 99.2°F | Resp 14

## 2013-03-19 DIAGNOSIS — L723 Sebaceous cyst: Secondary | ICD-10-CM | POA: Insufficient documentation

## 2013-03-19 NOTE — Progress Notes (Signed)
Subjective: wound check     Patient ID: Benjamin [name], male   DOB: 1950/09/18, 62 y.o.   MRN: [id number]  HPI [name] presents to clinic today following a bedside I&D during his hospitalization on 02/19/13.  He has been residing at a SNF.  He has been taking doxycycline since his discharge.  Daily dressing changes. He denies fever, chills or sweats.  He reports that the wound is looking good per nursing.    Review of Systems  Constitutional: Negative for fever and chills.  Gastrointestinal: Negative for nausea and vomiting.  Skin: Positive for wound. Negative for color change, pallor and rash.       Objective:   Physical Exam  Constitutional: He appears well-developed. No distress.  Neck: Normal range of motion. Neck supple.  Skin: Skin is warm and dry. No rash noted. He is not diaphoretic. No erythema. No pallor.  Healed wound to upper back.  No erythema, drainage.  Dressing was removed, can leave open to air.         Assessment:     Infected Sebaceous Cyst    Plan:     S/p I&D.  The wound has healed nicely.  Stop doxycycline.  Can leave open to air.  Follow up in clinic as needed.

## 2013-03-19 NOTE — Patient Instructions (Signed)
Please stop taking the doxycycline.  The wound is healed well.  Leave open to air.   Follow up with [facility] [place] [facility] as needed.

## 2013-07-17 ENCOUNTER — Emergency Department (HOSPITAL_COMMUNITY): Payer: Medicaid Other

## 2013-07-17 ENCOUNTER — Encounter (HOSPITAL_COMMUNITY): Payer: Self-pay | Admitting: Emergency Medicine

## 2013-07-17 ENCOUNTER — Inpatient Hospital Stay (HOSPITAL_COMMUNITY)
Admission: EM | Admit: 2013-07-17 | Discharge: 2013-07-22 | DRG: 065 | Disposition: A | Discharge status: Skilled Nursing Facility | Payer: Medicaid Other | Attending: Internal Medicine | Admitting: Internal Medicine

## 2013-07-17 DIAGNOSIS — G8190 Hemiplegia, unspecified affecting unspecified side: Secondary | ICD-10-CM | POA: Diagnosis present

## 2013-07-17 DIAGNOSIS — D649 Anemia, unspecified: Secondary | ICD-10-CM | POA: Diagnosis present

## 2013-07-17 DIAGNOSIS — I6350 Cerebral infarction due to unspecified occlusion or stenosis of unspecified cerebral artery: Principal | ICD-10-CM

## 2013-07-17 DIAGNOSIS — R4789 Other speech disturbances: Secondary | ICD-10-CM | POA: Diagnosis present

## 2013-07-17 DIAGNOSIS — L408 Other psoriasis: Secondary | ICD-10-CM | POA: Diagnosis present

## 2013-07-17 DIAGNOSIS — L0390 Cellulitis, unspecified: Secondary | ICD-10-CM

## 2013-07-17 DIAGNOSIS — E538 Deficiency of other specified B group vitamins: Secondary | ICD-10-CM | POA: Diagnosis present

## 2013-07-17 DIAGNOSIS — Z7982 Long term (current) use of aspirin: Secondary | ICD-10-CM

## 2013-07-17 DIAGNOSIS — E785 Hyperlipidemia, unspecified: Secondary | ICD-10-CM | POA: Diagnosis present

## 2013-07-17 DIAGNOSIS — R1314 Dysphagia, pharyngoesophageal phase: Secondary | ICD-10-CM

## 2013-07-17 DIAGNOSIS — R29898 Other symptoms and signs involving the musculoskeletal system: Secondary | ICD-10-CM

## 2013-07-17 DIAGNOSIS — L03119 Cellulitis of unspecified part of limb: Secondary | ICD-10-CM

## 2013-07-17 DIAGNOSIS — Z87891 Personal history of nicotine dependence: Secondary | ICD-10-CM

## 2013-07-17 DIAGNOSIS — R531 Weakness: Secondary | ICD-10-CM | POA: Diagnosis present

## 2013-07-17 DIAGNOSIS — I639 Cerebral infarction, unspecified: Secondary | ICD-10-CM | POA: Diagnosis present

## 2013-07-17 DIAGNOSIS — Z823 Family history of stroke: Secondary | ICD-10-CM

## 2013-07-17 DIAGNOSIS — L0291 Cutaneous abscess, unspecified: Secondary | ICD-10-CM

## 2013-07-17 DIAGNOSIS — R627 Adult failure to thrive: Secondary | ICD-10-CM

## 2013-07-17 DIAGNOSIS — L02419 Cutaneous abscess of limb, unspecified: Secondary | ICD-10-CM | POA: Diagnosis present

## 2013-07-17 LAB — COMPREHENSIVE METABOLIC PANEL
ALT: 27 U/L (ref 0–53)
AST: 59 U/L — AB (ref 0–37)
Albumin: 3.2 g/dL — ABNORMAL LOW (ref 3.5–5.2)
Alkaline Phosphatase: 159 U/L — ABNORMAL HIGH (ref 39–117)
BILIRUBIN TOTAL: 0.7 mg/dL (ref 0.3–1.2)
BUN: 32 mg/dL — ABNORMAL HIGH (ref 6–23)
CHLORIDE: 94 meq/L — AB (ref 96–112)
CO2: 26 mEq/L (ref 19–32)
Calcium: 10.2 mg/dL (ref 8.4–10.5)
Creatinine, Ser: 0.95 mg/dL (ref 0.50–1.35)
GFR, EST NON AFRICAN AMERICAN: 88 mL/min — AB (ref >90)
GLUCOSE: 135 mg/dL — AB (ref 70–99)
Potassium: 4.2 mEq/L (ref 3.7–5.3)
SODIUM: 138 meq/L (ref 137–147)
Total Protein: 8.4 g/dL — ABNORMAL HIGH (ref 6.0–8.3)

## 2013-07-17 LAB — URINALYSIS, ROUTINE W REFLEX MICROSCOPIC
Glucose, UA: NEGATIVE mg/dL
HGB URINE DIPSTICK: NEGATIVE
KETONES UR: NEGATIVE mg/dL
Nitrite: NEGATIVE
PROTEIN: NEGATIVE mg/dL
Specific Gravity, Urine: 1.03 (ref 1.005–1.030)
UROBILINOGEN UA: 1 mg/dL (ref 0.0–1.0)
pH: 6 (ref 5.0–8.0)

## 2013-07-17 LAB — CBC
HCT: 42.2 % (ref 39.0–52.0)
Hemoglobin: 14.3 g/dL (ref 13.0–17.0)
MCH: 30.4 pg (ref 26.0–34.0)
MCHC: 33.9 g/dL (ref 30.0–36.0)
MCV: 89.8 fL (ref 78.0–100.0)
PLATELETS: 168 10*3/uL (ref 150–400)
RBC: 4.7 MIL/uL (ref 4.22–5.81)
RDW: 12.8 % (ref 11.5–15.5)
WBC: 11.9 10*3/uL — AB (ref 4.0–10.5)

## 2013-07-17 LAB — URINE MICROSCOPIC-ADD ON

## 2013-07-17 LAB — I-STAT CG4 LACTIC ACID, ED: LACTIC ACID, VENOUS: 3.51 mmol/L — AB (ref 0.5–2.2)

## 2013-07-17 LAB — I-STAT TROPONIN, ED: Troponin i, poc: 0.02 ng/mL (ref 0.00–0.08)

## 2013-07-17 MED ORDER — ASPIRIN 300 MG RE SUPP
300.0000 mg | Freq: Every day | RECTAL | Status: DC
  Filled 2013-07-17: qty 1

## 2013-07-17 MED ORDER — ASPIRIN 81 MG PO CHEW: 324.0000 mg | CHEWABLE_TABLET | Freq: Once | ORAL | Status: DC

## 2013-07-17 MED ORDER — VANCOMYCIN HCL IN DEXTROSE 750-5 MG/150ML-% IV SOLN
750.0000 mg | Freq: Two times a day (BID) | INTRAVENOUS | Status: DC
  Administered 2013-07-18: 750 mg via INTRAVENOUS
  Filled 2013-07-17 (×2): qty 150

## 2013-07-17 MED ORDER — ASPIRIN 300 MG RE SUPP
300.0000 mg | Freq: Once | RECTAL | Status: AC
  Administered 2013-07-18: 300 mg via RECTAL
  Filled 2013-07-17: qty 1

## 2013-07-17 MED ORDER — VANCOMYCIN HCL 10 G IV SOLR
1250.0000 mg | Freq: Once | INTRAVENOUS | Status: AC
  Administered 2013-07-17: 1250 mg via INTRAVENOUS
  Filled 2013-07-17: qty 1250

## 2013-07-17 MED ORDER — SENNOSIDES-DOCUSATE SODIUM 8.6-50 MG PO TABS: 1.0000 | ORAL_TABLET | Freq: Every evening | ORAL | Status: DC | PRN

## 2013-07-17 MED ORDER — SODIUM CHLORIDE 0.9 % IV SOLN
INTRAVENOUS | Status: AC
  Administered 2013-07-17 – 2013-07-18 (×3): via INTRAVENOUS

## 2013-07-17 MED ORDER — SODIUM CHLORIDE 0.9 % IV BOLUS (SEPSIS)
1000.0000 mL | Freq: Once | INTRAVENOUS | Status: AC
  Administered 2013-07-17: 1000 mL via INTRAVENOUS

## 2013-07-17 MED ORDER — ENOXAPARIN SODIUM 40 MG/0.4ML ~~LOC~~ SOLN
40.0000 mg | Freq: Every day | SUBCUTANEOUS | Status: DC
  Administered 2013-07-18 – 2013-07-22 (×5): 40 mg via SUBCUTANEOUS
  Filled 2013-07-17 (×5): qty 0.4

## 2013-07-17 MED ORDER — ASPIRIN 325 MG PO TABS
325.0000 mg | ORAL_TABLET | Freq: Every day | ORAL | Status: DC
  Administered 2013-07-18: 325 mg via ORAL
  Filled 2013-07-17: qty 1

## 2013-07-17 NOTE — ED Notes (Signed)
Pt + for stroke per MRI PA notified; MRA requested to be done

## 2013-07-17 NOTE — ED Provider Notes (Signed)
CSN: [id number]     Arrival date & time 07/17/13  1300 History   First MD Initiated Contact with Patient 07/17/13 1300     Chief Complaint  Patient presents with  . Weakness     (Consider location/radiation/quality/duration/timing/severity/associated sxs/prior Treatment) HPI Comments: Patient is a 63 year old male with a past medical history of stroke who presents to the emergency department via EMS from home complaining of generalized weakness x2 weeks. [name] was present at home when EMS arrived, however she does not live there. According to EMS, sister stated that since 07/09/2013, patient's weakness has increased. He is normally able to walk with a walker, however recently has been unable to do so. Patient has also urinated himself, denies any urinary symptoms, denies dysuria, abdominal pain, nausea, vomiting, fever, chills, chest pain or shortness of breath. According to family member to EMS, pt is at his baseline mentation.  Patient is a 63 y.o. male presenting with weakness. The history is provided by the patient and the EMS personnel.  Weakness Associated symptoms include weakness.    Past Medical History  Diagnosis Date  . Stroke    Past Surgical History  Procedure Laterality Date  . Other surgical history      Stent, unknown  . Incision and drainage      back wound  . Back surgery     No family history on file. History  Substance Use Topics  . Smoking status: Former Smoker -- .5 years    Types: Cigarettes  . Smokeless tobacco: Never Used  . Alcohol Use: No    Review of Systems  Neurological: Positive for weakness.  All other systems reviewed and are negative.      Allergies  Review of patient's allergies indicates no known allergies.  Home Medications   Current Outpatient Rx  Name  Route  Sig  Dispense  Refill  . aspirin 325 MG tablet   Oral   Take 1 tablet (325 mg total) by mouth daily.   30 tablet   0   . cholecalciferol (VITAMIN D)  1000 UNITS tablet   Oral   Take 1,000 Units by mouth every morning.         . furosemide (LASIX) 20 MG tablet   Oral   Take 20 mg by mouth.         . Potassium Chloride (KLOR-CON PO)   Oral   Take by mouth.         . vitamin B-12 1000 MCG tablet   Oral   Take 1 tablet (1,000 mcg total) by mouth daily.   30 tablet   0    BP 131/79  Pulse 110  Temp(Src) 99.6 F (37.6 C) (Rectal)  Resp 20  SpO2 100% Physical Exam  Nursing note and vitals reviewed. Constitutional: He is oriented to person, place, and time. He appears well-developed.  Unkempt, foul odor.  HENT:  Head: Normocephalic and atraumatic.  Mouth/Throat: Oropharynx is clear and moist.  Eyes: Conjunctivae and EOM are normal. Pupils are equal, round, and reactive to light.  Neck: Normal range of motion. Neck supple.  Cardiovascular: Regular rhythm and normal heart sounds.  Tachycardia present.   Pulmonary/Chest: Effort normal and breath sounds normal.  Abdominal: Soft. Bowel sounds are normal. There is no tenderness.  Genitourinary:  Urinated himself.  Musculoskeletal: Normal range of motion. He exhibits no edema.  Neurological: He is alert and oriented to person, place, and time. No sensory deficit. GCS eye subscore is 4. GCS  verbal subscore is 5. GCS motor subscore is 6.  Shaky. Mild expressive aphasia, normal per pt s/p stroke. Right sided facial droop.  Skin: Skin is warm and dry. He is not diaphoretic.  Skin sloughing of bilateral feet, moist. Non-tender. No signs of infection. Sensation intact. Superficial ulceration of left lower leg, mild surrounding erythema.  Psychiatric: He has a normal mood and affect. He is slowed.    ED Course  Procedures (including critical care time) Labs Review Labs Reviewed  I-STAT CG4 LACTIC ACID, ED - Abnormal; Notable for the following:    Lactic Acid, Venous 3.51 (*)    All other components within normal limits  CULTURE, BLOOD (ROUTINE X 2)  CULTURE, BLOOD (ROUTINE  X 2)  URINE CULTURE  CBC  COMPREHENSIVE METABOLIC PANEL  URINALYSIS, ROUTINE W REFLEX MICROSCOPIC   Imaging Review No results found.   EKG Interpretation None      MDM   Final diagnoses:  None   Pt presenting with increased weakness over the past 2 weeks. History of stroke. According to EMS, patient had baseline mentation. He lives alone at home. Information given by sister. Patient appears unkempt, foul smelling, febrile at 99.9, 99.6 rectally. Tachycardic. Urinated himself. Concern for urosepsis. Labs pending. CT head pending. Patient receiving IV fluids. Case discussed with attending Dr. Deretha Emory who agrees with plan of care. 4:43 PM Sister present with patient, states facial droop and speech are not normal for him. States he has been leaning to the right on his recliner over the past week when he normally sits up. HR now normal. No UTI. Lactic acid 3.51. WBC 11.9.  No meningeal signs, doubt meningitis. Plan to consult neurology. 5:15 PM Pt seen by neuro hospitalist Dr. Amada Jupiter who advised MRI, if positive admit pt. MRI pending. 7:06 PM IV vanc started for probable cellulitis of left lower extremity. MRI still pending. Pt seen by Dr. Deretha Emory. 8:00 PM MRI still pending. Pt signed out to [employer], PA-C at shift change. Plan to admit pt once MRI resulted.  Benjamin Mace, PA-C 07/18/13 1517

## 2013-07-17 NOTE — ED Provider Notes (Signed)
Medical screening examination/treatment/procedure(s) were conducted as a shared visit with non-physician practitioner(s) and myself.  I personally evaluated the patient during the encounter.   EKG Interpretation None     Results for orders placed during the hospital encounter of 07/17/13  CBC      Result Value Ref Range   WBC 11.9 (*) 4.0 - 10.5 K/uL   RBC 4.70  4.22 - 5.81 MIL/uL   Hemoglobin 14.3  13.0 - 17.0 g/dL   HCT 21.342.2  08.639.0 - 57.852.0 %   MCV 89.8  78.0 - 100.0 fL   MCH 30.4  26.0 - 34.0 pg   MCHC 33.9  30.0 - 36.0 g/dL   RDW 46.912.8  62.911.5 - 52.815.5 %   Platelets 168  150 - 400 K/uL  COMPREHENSIVE METABOLIC PANEL      Result Value Ref Range   Sodium 138  137 - 147 mEq/L   Potassium 4.2  3.7 - 5.3 mEq/L   Chloride 94 (*) 96 - 112 mEq/L   CO2 26  19 - 32 mEq/L   Glucose, Bld 135 (*) 70 - 99 mg/dL   BUN 32 (*) 6 - 23 mg/dL   Creatinine, Ser 4.130.95  0.50 - 1.35 mg/dL   Calcium 24.410.2  8.4 - 01.010.5 mg/dL   Total Protein 8.4 (*) 6.0 - 8.3 g/dL   Albumin 3.2 (*) 3.5 - 5.2 g/dL   AST 59 (*) 0 - 37 U/L   ALT 27  0 - 53 U/L   Alkaline Phosphatase 159 (*) 39 - 117 U/L   Total Bilirubin 0.7  0.3 - 1.2 mg/dL   GFR calc non Af Amer 88 (*) >90 mL/min   GFR calc Af Amer >90  >90 mL/min  URINALYSIS, ROUTINE W REFLEX MICROSCOPIC      Result Value Ref Range   Color, Urine AMBER (*) YELLOW   APPearance CLEAR  CLEAR   Specific Gravity, Urine 1.030  1.005 - 1.030   pH 6.0  5.0 - 8.0   Glucose, UA NEGATIVE  NEGATIVE mg/dL   Hgb urine dipstick NEGATIVE  NEGATIVE   Bilirubin Urine SMALL (*) NEGATIVE   Ketones, ur NEGATIVE  NEGATIVE mg/dL   Protein, ur NEGATIVE  NEGATIVE mg/dL   Urobilinogen, UA 1.0  0.0 - 1.0 mg/dL   Nitrite NEGATIVE  NEGATIVE   Leukocytes, UA TRACE (*) NEGATIVE  URINE MICROSCOPIC-ADD ON      Result Value Ref Range   Squamous Epithelial / LPF RARE  RARE   WBC, UA 0-2  <3 WBC/hpf   Bacteria, UA RARE  RARE   Casts HYALINE CASTS (*) NEGATIVE  I-STAT CG4 LACTIC ACID, ED   Result Value Ref Range   Lactic Acid, Venous 3.51 (*) 0.5 - 2.2 mmol/L   Dg Chest 2 View  07/17/2013   CLINICAL DATA:  Weakness.  Fell today.  EXAM: CHEST  2 VIEW  COMPARISON:  02/16/2013  FINDINGS: The heart size and mediastinal contours are within normal limits. Both lungs are clear. The visualized skeletal structures are unremarkable.  IMPRESSION: No active cardiopulmonary disease.   Electronically Signed   By: Amie Portlandavid  Ormond M.D.   On: 07/17/2013 17:23   Ct Head Wo Contrast  07/17/2013   CLINICAL DATA:  Generalized weakness.  EXAM: CT HEAD WITHOUT CONTRAST  TECHNIQUE: Contiguous axial images were obtained from the base of the skull through the vertex without contrast.  COMPARISON:  MR HEAD W/O CM dated 02/17/2013; MR MRA HEAD W/O CM dated 02/17/2013;  CT HEAD W/O CM dated 02/16/2013  FINDINGS: Resolved right basal ganglia infarction with gliosis and encephalomalacia. Generalized atrophy. Chronic microvascular ischemic change. Scattered chronic lacunar infarctions elsewhere throughout the white matter. Remote bilateral right greater than left cerebellar infarcts.  No definite acute cortical infarction, acute hemorrhage, mass lesion, hydrocephalus, or extra-axial fluid. Calvarium intact. Moderately advanced maxillary and ethmoid sinus disease, worse on the right. No mastoid fluid. Vascular calcification.  IMPRESSION: Chronic changes as described. No definite acute ischemia or hemorrhage.   Electronically Signed   By: Davonna Belling M.D.   On: 07/17/2013 15:08    Patient will require admission. Patient brought in from home by EMS generalized weakness confusion not very responsive question will strokelike symptoms. Patient been urinating on himself. Recently reported his level of consciousness baseline but does sound like it is. Patient with early cellulitis or wound infection to the left leg. Would recommend antibiotics for that. Patient had stroke in the past. Seen by neurology. They feel that may be the  current illness is just exacerbating the old stroke symptoms not necessarily any new stroke symptoms.  Shelda Jakes, MD 07/17/13 [phone]

## 2013-07-17 NOTE — ED Notes (Signed)
Patient transported to MRI 

## 2013-07-17 NOTE — Progress Notes (Signed)
ANTIBIOTIC CONSULT NOTE - INITIAL  Pharmacy Consult for vancomycin Indication: cellulitis  No Known Allergies  Patient Measurements: Height: 5\' 10"  (177.8 cm) Weight: 150 lb (68.04 kg) IBW/kg (Calculated) : 73  Vital Signs: Temp: 99.6 F (37.6 C) (03/25 1349) Temp src: Rectal (03/25 1349) BP: 123/76 mmHg (03/25 1615) Pulse Rate: 95 (03/25 1615) Intake/Output from previous day:   Intake/Output from this shift:    Labs:  Recent Labs  07/17/13 1327  WBC 11.9*  HGB 14.3  PLT 168  CREATININE 0.95   Estimated Creatinine Clearance: 78.5 ml/min (by C-G formula based on Cr of 0.95). No results found for this basename: VANCOTROUGH, VANCOPEAK, VANCORANDOM, GENTTROUGH, GENTPEAK, GENTRANDOM, TOBRATROUGH, TOBRAPEAK, TOBRARND, AMIKACINPEAK, AMIKACINTROU, AMIKACIN,  in the last 72 hours   Microbiology: No results found for this or any previous visit (from the past 720 hour(s)).  Medical History: Past Medical History  Diagnosis Date  . Stroke    Assessment: 63 yo M presents to ED with generalized weakness and early cellulitis/wound infection on left leg.  Pharmacy is consulted to dose vancomycin.  Initial labs reveal WBC of 11.9 and SCr 0.95 with estimated CrCl ~79.  Patient is currently afebrile.  Goal of Therapy:  Vancomycin trough level 10-15 mcg/ml  Plan:  - give vancomycin IV 1250mg  loading dose, followed by 750mg  q12h - [facility] - monitor kidney function, WBC, temperature curve, any cultures, and clinical progression  Harrold DonathNathan E. Achilles Dunkope, PharmD Clinical Pharmacist - Resident Pager: [phone] Pharmacy: [phone] 07/17/2013 6:31 PM

## 2013-07-17 NOTE — Progress Notes (Signed)
Patient arrived to room via stretcher. He is alert and oriented, accompanied by niece. He denied any pain or headache. Orders and safety precautions reviewed with patient. Call light with reach. Bed on low positioned and exit alarm activated. MD paged. Will continue to monitor.  Sim BoastHavy, RN

## 2013-07-17 NOTE — ED Notes (Signed)
[facility]. Pt sister stated that ever since 07/09/13 pt weakness has increased. Stated to EMS that pt normally is able to walk with a walker but now is unable to walk well. Foul smelling urine can be smelled at bedside. Pt denies burning with urination. BP-132/86 HR-126 Pt is level of consciousness is at baseline per family to EMS.

## 2013-07-17 NOTE — ED Notes (Signed)
Lactic acid [place]

## 2013-07-17 NOTE — ED Notes (Addendum)
Hospitalist MD at bedside. 

## 2013-07-17 NOTE — ED Provider Notes (Signed)
Pt received from [place], [place].  Pt presented to ED w/ generalized weakness w/ limited ability to ambulate, as well as R-sided facial droop, change in speech and leaning to R side.  Evaluated by Dr. Amada JupiterKirkpatrick and MRI brain recommended.  MRI shows acute small vessel CVA.  Dr. Thad Rangereynolds consulted and recommends aspirin and admission to medicine.  Pt failed swallow screen and aspirin administered PR.  He has been treated w/ IV vanc for LLE cellulitis as well.  [facility] consulted for admission.  All test results and plan discussed w/ patient and his sister.    Benjamin Miuatherine E Fabiola Mudgett, PA-C 07/17/13 [phone]

## 2013-07-17 NOTE — ED Notes (Signed)
PA at bedside.

## 2013-07-17 NOTE — H&P (Signed)
[facility] History and Physical  [name]:[id number] DOB: 1951/03/21 DOA: 07/17/2013  Referring physician: ER physician. PCP: Dartha Lodge, FNP   Chief Complaint: Weakness in the left lower extremity and left facial drooping.  HPI: Benjamin [name] is a 63 y.o. male with history of previous stroke in October 2014 was brought to the ER but patient's sister noted that patient has been having difficulty walking due to weakness in the left lower extremity. As per patient's sister the patient has been having weakness over the last one week and has been slumping towards the right side. Patient became more weaker today and since patient had a difficult to walk was brought to the ER. CT of the head was done which did not show anything acute followed by MRI of the brain which shows possible stroke. Neurologist on-call was consulted and at this time patient has been admitted for further stroke workup. In addition patient noticed to have left lower extremity psoriasis for which patient has been started vancomycin. Patient is a poor historian and most of the history obtained from patient's sister. Patient does not complain of any chest pain shortness of breath nausea vomiting abdominal pain. Denies any difficulty talking or swallowing or any headaches or visual symptoms.   Review of Systems: As presented in the history of presenting illness, rest negative.  Past Medical History  Diagnosis Date  . Stroke    Past Surgical History  Procedure Laterality Date  . Other surgical history      Stent, unknown  . Incision and drainage      back wound  . Back surgery     Social History:  reports that he has quit smoking. His smoking use included Cigarettes. He smoked 0.00 packs per day for .5 years. He has never used smokeless tobacco. He reports that he does not drink alcohol or use illicit drugs. Where does patient live home. Can patient participate in ADLs? Not sure.  No Known  Allergies  Family History:  Family History  Problem Relation Age of Onset  . Diabetes Mellitus II Mother   . Stroke Brother       Prior to Admission medications   Medication Sig Start Date End Date Taking? Authorizing Provider  aspirin 325 MG tablet Take 1 tablet (325 mg total) by mouth daily. 02/20/13  Yes Belkys A Regalado, MD  furosemide (LASIX) 20 MG tablet Take 20 mg by mouth.   Yes Historical Provider, MD    Physical Exam: Filed Vitals:   07/17/13 1915 07/17/13 2045 07/17/13 2130 07/17/13 2200  BP: 130/73 126/72 118/68 123/68  Pulse: 99 93 89 87  Temp:      TempSrc:      Resp:      Height:      Weight:      SpO2: 96% 97% 97% 97%     General:  Well-developed and poorly nourished.  Eyes: Anicteric no pallor.  ENT: No discharge from ears eyes nose mouth.  Neck: No mass felt.  Cardiovascular: S1-S2 heard.  Respiratory: No rhonchi or crepitations.  Abdomen: Soft nontender bowel sounds present.  Skin: Left lower extremity rash.  Musculoskeletal: No edema.  Psychiatric: Appears normal.  Neurologic: Alert awake oriented to time place and person. Moves all extremities.  Labs on Admission:  Basic Metabolic Panel:  Recent Labs Lab 07/17/13 1327  NA 138  K 4.2  CL 94*  CO2 26  GLUCOSE 135*  BUN 32*  CREATININE 0.95  CALCIUM 10.2  Liver Function Tests:  Recent Labs Lab 07/17/13 1327  AST 59*  ALT 27  ALKPHOS 159*  BILITOT 0.7  PROT 8.4*  ALBUMIN 3.2*   No results found for this basename: LIPASE, AMYLASE,  in the last 168 hours No results found for this basename: AMMONIA,  in the last 168 hours CBC:  Recent Labs Lab 07/17/13 1327  WBC 11.9*  HGB 14.3  HCT 42.2  MCV 89.8  PLT 168   Cardiac Enzymes: No results found for this basename: CKTOTAL, CKMB, CKMBINDEX, TROPONINI,  in the last 168 hours  BNP (last 3 results) No results found for this basename: PROBNP,  in the last 8760 hours CBG: No results found for this basename: GLUCAP,   in the last 168 hours  Radiological Exams on Admission: Dg Chest 2 View  07/17/2013   CLINICAL DATA:  Weakness.  Fell today.  EXAM: CHEST  2 VIEW  COMPARISON:  02/16/2013  FINDINGS: The heart size and mediastinal contours are within normal limits. Both lungs are clear. The visualized skeletal structures are unremarkable.  IMPRESSION: No active cardiopulmonary disease.   Electronically Signed   By: Amie Portland M.D.   On: 07/17/2013 17:23   Ct Head Wo Contrast  07/17/2013   CLINICAL DATA:  Generalized weakness.  EXAM: CT HEAD WITHOUT CONTRAST  TECHNIQUE: Contiguous axial images were obtained from the base of the skull through the vertex without contrast.  COMPARISON:  MR HEAD W/O CM dated 02/17/2013; MR MRA HEAD W/O CM dated 02/17/2013; CT HEAD W/O CM dated 02/16/2013  FINDINGS: Resolved right basal ganglia infarction with gliosis and encephalomalacia. Generalized atrophy. Chronic microvascular ischemic change. Scattered chronic lacunar infarctions elsewhere throughout the white matter. Remote bilateral right greater than left cerebellar infarcts.  No definite acute cortical infarction, acute hemorrhage, mass lesion, hydrocephalus, or extra-axial fluid. Calvarium intact. Moderately advanced maxillary and ethmoid sinus disease, worse on the right. No mastoid fluid. Vascular calcification.  IMPRESSION: Chronic changes as described. No definite acute ischemia or hemorrhage.   Electronically Signed   By: Davonna Belling M.D.   On: 07/17/2013 15:08   [name] Head Wo Contrast  07/17/2013   CLINICAL DATA:  63 year old male generalized weakness, increasing since 07/09/2013. Initial encounter.  EXAM: MRI HEAD WITHOUT CONTRAST  MRA HEAD WITHOUT CONTRAST  TECHNIQUE: Multiplanar, multiecho pulse sequences of the brain and surrounding structures were obtained without intravenous contrast. Angiographic images of the head were obtained using MRA technique without contrast.  COMPARISON:  Head CT without contrast 07/17/2013.  Brain MRI and MRA 02/17/2013.  FINDINGS: MRI HEAD FINDINGS  New confluent 3 cm area of restricted diffusion in the right corona radiata (series 4, image 21). Superimposed recurrent right caudate nucleus restricted diffusion, now along the inferior aspect of the caudate head (inferior to the lesion in October).  No contralateral left hemisphere or posterior fossa restricted diffusion. Major intracranial vascular flow voids are stable.  Underlying multifocal chronic (mostly lacunar) infarcts in the bilateral deep gray matter nuclei, the brainstem,, and bilateral cerebellum. Patchy and confluent underlying cerebral white matter T2 and FLAIR hyperintensity not significantly changed outside of the acute findings. Stable cerebral volume. No ventriculomegaly. Occasional chronic micro hemorrhages in the brain are stable. No acute intracranial hemorrhage identified. Negative pituitary and cervicomedullary junction. Stable visualized cervical spine. Stable bone marrow signal.  Chronic paranasal sinus inflammatory changes and opacification have progressed on the right side. Stable left maxillary sinus mucous retention cyst. Mastoids remain clear. Visualized orbit soft tissues are within normal  limits. Visible internal auditory structures appear normal. Visualized scalp soft tissues are within normal limits.  MRA HEAD FINDINGS  Stable antegrade flow in the posterior circulation. Patent vertebrobasilar junction with no distal vertebral artery stenosis. No basilar artery stenosis. Patent AICA origins. SCA and PCA origins are normal. Posterior communicating arteries are normal. Bilateral PCA branches are stable, with mild irregularity of the right P1 segment. Preserved distal PCA flow.  Stable antegrade flow in both ICA siphons. ICA siphon irregularity in keeping with atherosclerosis but no ICA stenosis. Ophthalmic and posterior communicating artery origins are normal. Stable carotid termini. Stable MCA and ACA origins. Tortuous  but otherwise negative visualized ACA is. Anterior communicating artery diminutive or absent. Left MCA branches stable and within normal limits.  Right MCA M1 segment is stable and within normal limits. Right MCA trifurcation is stable and within normal limits. No right MCA branch occlusion is identified.  IMPRESSION: 1. Acute on chronic small vessel ischemia in the right corona radiata and right caudate nucleus. No associated mass effect or hemorrhage. 2. Stable and negative intracranial MRA except for relatively mild large vessel atherosclerosis. 3. Chronic but increased paranasal sinus disease on the right.   Electronically Signed   By: Augusto Gamble M.D.   On: 07/17/2013 20:41   Mr Brain Wo Contrast  07/17/2013   CLINICAL DATA:  63 year old male generalized weakness, increasing since 07/09/2013. Initial encounter.  EXAM: MRI HEAD WITHOUT CONTRAST  MRA HEAD WITHOUT CONTRAST  TECHNIQUE: Multiplanar, multiecho pulse sequences of the brain and surrounding structures were obtained without intravenous contrast. Angiographic images of the head were obtained using MRA technique without contrast.  COMPARISON:  Head CT without contrast 07/17/2013. Brain MRI and MRA 02/17/2013.  FINDINGS: MRI HEAD FINDINGS  New confluent 3 cm area of restricted diffusion in the right corona radiata (series 4, image 21). Superimposed recurrent right caudate nucleus restricted diffusion, now along the inferior aspect of the caudate head (inferior to the lesion in October).  No contralateral left hemisphere or posterior fossa restricted diffusion. Major intracranial vascular flow voids are stable.  Underlying multifocal chronic (mostly lacunar) infarcts in the bilateral deep gray matter nuclei, the brainstem,, and bilateral cerebellum. Patchy and confluent underlying cerebral white matter T2 and FLAIR hyperintensity not significantly changed outside of the acute findings. Stable cerebral volume. No ventriculomegaly. Occasional chronic micro  hemorrhages in the brain are stable. No acute intracranial hemorrhage identified. Negative pituitary and cervicomedullary junction. Stable visualized cervical spine. Stable bone marrow signal.  Chronic paranasal sinus inflammatory changes and opacification have progressed on the right side. Stable left maxillary sinus mucous retention cyst. Mastoids remain clear. Visualized orbit soft tissues are within normal limits. Visible internal auditory structures appear normal. Visualized scalp soft tissues are within normal limits.  MRA HEAD FINDINGS  Stable antegrade flow in the posterior circulation. Patent vertebrobasilar junction with no distal vertebral artery stenosis. No basilar artery stenosis. Patent AICA origins. SCA and PCA origins are normal. Posterior communicating arteries are normal. Bilateral PCA branches are stable, with mild irregularity of the right P1 segment. Preserved distal PCA flow.  Stable antegrade flow in both ICA siphons. ICA siphon irregularity in keeping with atherosclerosis but no ICA stenosis. Ophthalmic and posterior communicating artery origins are normal. Stable carotid termini. Stable MCA and ACA origins. Tortuous but otherwise negative visualized ACA is. Anterior communicating artery diminutive or absent. Left MCA branches stable and within normal limits.  Right MCA M1 segment is stable and within normal limits. Right MCA trifurcation is stable  and within normal limits. No right MCA branch occlusion is identified.  IMPRESSION: 1. Acute on chronic small vessel ischemia in the right corona radiata and right caudate nucleus. No associated mass effect or hemorrhage. 2. Stable and negative intracranial MRA except for relatively mild large vessel atherosclerosis. 3. Chronic but increased paranasal sinus disease on the right.   Electronically Signed   By: Augusto GambleLee  Hall M.D.   On: 07/17/2013 20:41    EKG: Independently reviewed. Sinus tachycardia.  Assessment/Plan Principal Problem:   CVA  (cerebral infarction) Active Problems:   B12 deficiency   Cellulitis   1. CVA - patient has been placed on neurochecks swallow evaluation. 2-D echo carotid Dopplers has been ordered. Aspirin. Physical therapy and swallow evaluation. Hold Lasix for now. 2. Possible cellulitis of the left lower extremity - patient has been placed on vancomycin. 3. History of B12 deficiency per the chart. 4. Patient's albumin level is mildly low and globulin levels are high - we will check SPEP.    Code Status: Full code.  Family Communication: [name].  Disposition Plan: Admit to inpatient.    Machelle Raybon N. [facility] Pager [phone].  If 7PM-7AM, please contact night-coverage [email or web address] Password [email or web address] 07/17/2013, 10:31 PM

## 2013-07-18 ENCOUNTER — Inpatient Hospital Stay (HOSPITAL_COMMUNITY): Payer: Medicaid Other

## 2013-07-18 DIAGNOSIS — R29898 Other symptoms and signs involving the musculoskeletal system: Secondary | ICD-10-CM

## 2013-07-18 LAB — CBC WITH DIFFERENTIAL/PLATELET
Basophils Absolute: 0 10*3/uL (ref 0.0–0.1)
Basophils Relative: 0 % (ref 0–1)
Eosinophils Absolute: 0.1 10*3/uL (ref 0.0–0.7)
Eosinophils Relative: 2 % (ref 0–5)
HEMATOCRIT: 33.8 % — AB (ref 39.0–52.0)
HEMOGLOBIN: 11.1 g/dL — AB (ref 13.0–17.0)
LYMPHS PCT: 17 % (ref 12–46)
Lymphs Abs: 1.3 10*3/uL (ref 0.7–4.0)
MCH: 29.6 pg (ref 26.0–34.0)
MCHC: 32.8 g/dL (ref 30.0–36.0)
MCV: 90.1 fL (ref 78.0–100.0)
MONO ABS: 0.6 10*3/uL (ref 0.1–1.0)
Monocytes Relative: 8 % (ref 3–12)
NEUTROS ABS: 5.5 10*3/uL (ref 1.7–7.7)
Neutrophils Relative %: 73 % (ref 43–77)
Platelets: 156 10*3/uL (ref 150–400)
RBC: 3.75 MIL/uL — ABNORMAL LOW (ref 4.22–5.81)
RDW: 12.9 % (ref 11.5–15.5)
WBC: 7.6 10*3/uL (ref 4.0–10.5)

## 2013-07-18 LAB — COMPREHENSIVE METABOLIC PANEL
ALT: 19 U/L (ref 0–53)
AST: 47 U/L — ABNORMAL HIGH (ref 0–37)
Albumin: 2.6 g/dL — ABNORMAL LOW (ref 3.5–5.2)
Alkaline Phosphatase: 121 U/L — ABNORMAL HIGH (ref 39–117)
BUN: 24 mg/dL — AB (ref 6–23)
CALCIUM: 9 mg/dL (ref 8.4–10.5)
CO2: 25 mEq/L (ref 19–32)
Chloride: 104 mEq/L (ref 96–112)
Creatinine, Ser: 0.73 mg/dL (ref 0.50–1.35)
GFR calc non Af Amer: >90 mL/min (ref >90)
Glucose, Bld: 95 mg/dL (ref 70–99)
Potassium: 3.9 mEq/L (ref 3.7–5.3)
SODIUM: 141 meq/L (ref 137–147)
TOTAL PROTEIN: 6.4 g/dL (ref 6.0–8.3)
Total Bilirubin: 0.6 mg/dL (ref 0.3–1.2)

## 2013-07-18 LAB — URINE CULTURE
Colony Count: NO GROWTH
Culture: NO GROWTH

## 2013-07-18 LAB — LIPID PANEL
Cholesterol: 141 mg/dL (ref 0–200)
HDL: 32 mg/dL — ABNORMAL LOW (ref >39)
LDL Cholesterol: 92 mg/dL (ref 0–99)
TRIGLYCERIDES: 83 mg/dL (ref <150)
Total CHOL/HDL Ratio: 4.4 RATIO
VLDL: 17 mg/dL (ref 0–40)

## 2013-07-18 LAB — TROPONIN I: Troponin I: <0.3 ng/mL (ref <0.30)

## 2013-07-18 LAB — HEMOGLOBIN A1C
HEMOGLOBIN A1C: 5.8 % — AB (ref <5.7)
Mean Plasma Glucose: 120 mg/dL — ABNORMAL HIGH (ref <117)

## 2013-07-18 LAB — LACTIC ACID, PLASMA: Lactic Acid, Venous: 0.7 mmol/L (ref 0.5–2.2)

## 2013-07-18 LAB — TSH: TSH: 0.792 u[IU]/mL (ref 0.350–4.500)

## 2013-07-18 MED ORDER — VANCOMYCIN HCL IN DEXTROSE 1-5 GM/200ML-% IV SOLN
1000.0000 mg | Freq: Two times a day (BID) | INTRAVENOUS | Status: DC
  Administered 2013-07-18 – 2013-07-19 (×2): 1000 mg via INTRAVENOUS
  Filled 2013-07-18 (×4): qty 200

## 2013-07-18 MED ORDER — CLOPIDOGREL BISULFATE 75 MG PO TABS
75.0000 mg | ORAL_TABLET | Freq: Every day | ORAL | Status: DC
  Administered 2013-07-18 – 2013-07-22 (×5): 75 mg via ORAL
  Filled 2013-07-18 (×7): qty 1

## 2013-07-18 NOTE — Evaluation (Signed)
**Note Benjamin-Identified via Obfuscation** Physical Therapy Evaluation Patient Details Name: Benjamin BlanchGarry S Shan MRN: [id number] DOB: 1951/02/12 Today's Date: 07/18/2013   History of Present Illness  Benjamin [name] is a 63 y.o. male who reported that he had a stroke in October of last year that affected his left side. He was ambulating with a walker. He noted that when he awakened the morning of 07/18/2013 he was unable to get to the bathroom with his walker like he usually does and felt that he was weaker on the left. He presented at that time for evaluation.   Clinical Impression  Patient demonstrates deficits as indicated. Will need continued PT to address deficits and facilitate discharge. Recommend dc to ST SNF for continued stroke rehabilitation.     Follow Up Recommendations SNF;Supervision/Assistance - 24 hour    Equipment Recommendations   (tbd)    Recommendations for Other Services       Precautions / Restrictions Precautions Precautions: Fall      Mobility  Bed Mobility Overal bed mobility: Needs Assistance Bed Mobility: Rolling;Sidelying to Sit;Supine to Sit Rolling: Min assist Sidelying to sit: Mod assist Supine to sit: Min assist     General bed mobility comments: patient able to roll back in forther in bed for hygiene with minimal assist, but required increased assist to come to EOB, did not fully reach EOB as patient declined further mobility at this time  Transfers                 General transfer comment: not assessed  Ambulation/Gait                Stairs            Wheelchair Mobility    Modified Rankin (Stroke Patients Only) Modified Rankin (Stroke Patients Only) Pre-Morbid Rankin Score: Moderate disability Modified Rankin: Severe disability     Balance Overall balance assessment: Needs assistance   Sitting balance-Leahy Scale: Poor Sitting balance - Comments: unable to maintain EOB without assist Postural control: Posterior lean                            Pertinent Vitals/Pain No pain indicated at this time    Home Living Family/patient expects to be discharged to:: Skilled nursing facility Living Arrangements: Alone             Home Equipment: Walker - 2 wheels      Prior Function Level of Independence: Independent with assistive device(s)         Comments: had brother staying with him the past few days     Hand Dominance   Dominant Hand: Right    Extremity/Trunk Assessment   Upper Extremity Assessment: Defer to OT evaluation           Lower Extremity Assessment: LLE deficits/detail   LLE Deficits / Details: some asymetrical weakness upon isolated testing 4/5 (funtional strength not assessed pt did not attempt standing     Communication   Communication: Expressive difficulties  Cognition     Overall Cognitive Status: Difficult to assess                      General Comments General comments (skin integrity, edema, etc.): decreased skin integrity, multiple areas of skin breakdown. patient soiled upon entering room, performed bathing and bed mobility during session.     Exercises        Assessment/Plan    PT Assessment Patient needs  continued PT services  PT Diagnosis Difficulty walking;Abnormality of gait;Generalized weakness   PT Problem List Decreased strength;Decreased range of motion;Decreased activity tolerance;Decreased balance;Decreased mobility;Decreased coordination;Impaired sensation;Decreased skin integrity  PT Treatment Interventions DME instruction;Gait training;Stair training;Functional mobility training;Therapeutic activities;Therapeutic exercise;Balance training;Cognitive remediation;Patient/family education   PT Goals (Current goals can be found in the Care Plan section) Acute Rehab PT Goals Patient Stated Goal: none stated PT Goal Formulation: With patient Time For Goal Achievement: 08/01/13 Potential to Achieve Goals: Fair    Frequency Min 3X/week   Barriers to  discharge Decreased caregiver support      End of Session   Activity Tolerance: Patient limited by fatigue Patient left: in bed;with call bell/phone within reach;with bed alarm set         Time: 1610-9604 PT Time Calculation (min): 26 min   Charges:   PT Evaluation $Initial PT Evaluation Tier I: 1 Procedure PT Treatments $Therapeutic Activity: 8-22 mins $Self Care/Home Management: 8-22   PT G CodesFabio Asa 07/18/2013, 3:56 PM Charlotte Crumb, PT DPT  [phone]

## 2013-07-18 NOTE — Procedures (Signed)
**Note Benjamin-Identified via Obfuscation** Objective Swallowing Evaluation: Modified Barium Swallowing Study  Patient Details  Name: Benjamin [name] Fossum MRN: [id number] Date of Birth: June 21, 1950  Today'[name] Date: 07/18/2013 Time: [id number] SLP Time Calculation (min): 20 min  Past Medical History:  Past Medical History  Diagnosis Date  . Stroke    Past Surgical History:  Past Surgical History  Procedure Laterality Date  . Other surgical history      Stent, unknown  . Incision and drainage      back wound  . Back surgery     HPI:  63 y.o. male with history of previous stroke in October 2014 was brought to the ER but patient'[name] sister noted that patient has been having difficulty walking due to weakness in the left lower extremity.  CT of the head was done which did not show anything acute followed by MRI of the brain which shows acute on chronic small vessel ischemia in the right corona radiata and right caudate nucleus. No associated mass effect or hemorrhage.  Denies any difficulty talking or swallowing or any headaches or visual symptoms.  CXR No active cardiopulmonary disease.      Assessment / Plan / Recommendation Clinical Impression  Dysphagia Diagnosis: Mild oral phase dysphagia;Moderate oral phase dysphagia;Severe pharyngeal phase dysphagia;Moderate pharyngeal phase dysphagia Clinical impression: Pt. exhibits a mild-moderate oropharyngeal dysphagia with lingual residue caused by decreased strength.  Pharyngeal deficits comprised of both sensory and motor components leading to frank silent aspiration of nectar and honey consistencies.  Volitional cough cleared a portion of aspirates/penetrates.  Therapy techniques attempted such as chin tuck, multiple swallows and hard cough with chin tuck ineffective. Significant aspiration risk with any consistency thinner than pudding/puree.  Recommend Dys 1 texture and PUDDING thick liquids, 2 swallows and hard cough, crush meds.  ST will follow for safety and trial of pharyngeal strengthening  exercies to facilitate muscle weakness.decreased ROM.     Treatment Recommendation  Therapy as outlined in treatment plan below    Diet Recommendation Dysphagia 1 (Puree);Pudding-thick liquid;No liquids   Liquid Administration via: Spoon Medication Administration: Crushed with puree Supervision: Patient able to self feed;Full supervision/cueing for compensatory strategies Compensations: Slow rate;Small sips/bites;Multiple dry swallows after each bite/sip;Hard cough after swallow (cough after 3 bites/sips) Postural Changes and/or Swallow Maneuvers: Seated upright 90 degrees;Upright 30-60 min after meal    Other  Recommendations Recommended Consults: MBS Oral Care Recommendations: Oral care BID   Follow Up Recommendations  Inpatient Rehab    Frequency and Duration min 2x/week  2 weeks   Pertinent Vitals/Pain WDL            Reason for Referral Objectively evaluate swallowing function   Oral Phase Oral Preparation/Oral Phase Oral Phase: Impaired Oral - Honey Oral - Honey Cup: Lingual/palatal residue Oral - Nectar Oral - Nectar Teaspoon: Lingual/palatal residue Oral - Nectar Cup: Lingual/palatal residue Oral - Solids Oral - Puree: Lingual/palatal residue (min)   Pharyngeal Phase Pharyngeal Phase Pharyngeal Phase: Impaired Pharyngeal - Honey Pharyngeal - Honey Teaspoon: Delayed swallow initiation;Premature spillage to valleculae;Pharyngeal residue - valleculae;Reduced tongue base retraction Pharyngeal - Honey Cup: Delayed swallow initiation;Premature spillage to valleculae;Penetration/Aspiration during swallow;Pharyngeal residue - valleculae;Pharyngeal residue - pyriform sinuses;Reduced laryngeal elevation;Reduced tongue base retraction;Reduced airway/laryngeal closure Penetration/Aspiration details (honey cup): Material enters airway, remains ABOVE vocal cords then ejected out;Material enters airway, passes BELOW cords without attempt by patient to eject out (silent  aspiration) Pharyngeal - Nectar Pharyngeal - Nectar Teaspoon: Delayed swallow initiation;Premature spillage to pyriform sinuses;Pharyngeal residue - valleculae;Pharyngeal residue - pyriform sinuses;Reduced  tongue base retraction;Reduced laryngeal elevation;Reduced airway/laryngeal closure;Reduced anterior laryngeal mobility Pharyngeal - Nectar Cup: Delayed swallow initiation;Premature spillage to pyriform sinuses;Reduced anterior laryngeal mobility;Reduced laryngeal elevation;Reduced airway/laryngeal closure;Reduced tongue base retraction;Penetration/Aspiration during swallow;Pharyngeal residue - pyriform sinuses;Pharyngeal residue - valleculae;Significant aspiration (Amount) Pharyngeal - Solids Pharyngeal - Puree: Delayed swallow initiation;Premature spillage to pyriform sinuses;Pharyngeal residue - valleculae;Reduced tongue base retraction  Cervical Esophageal Phase    GO    Cervical Esophageal Phase Cervical Esophageal Phase: [facility]         Darrow Bussing.Ed [employer] [phone]  07/18/2013

## 2013-07-18 NOTE — Evaluation (Signed)
**Note Benjamin-Identified via Obfuscation** Clinical/Bedside Swallow Evaluation Patient Details  Name: Benjamin [name] MRN: [id number] Date of Birth: 02-24-1951  Today's Date: 07/18/2013 Time: [id number] SLP Time Calculation (min): 25 min  Past Medical History:  Past Medical History  Diagnosis Date  . Stroke    Past Surgical History:  Past Surgical History  Procedure Laterality Date  . Other surgical history      Stent, unknown  . Incision and drainage      back wound  . Back surgery     HPI:  63 y.o. male with history of previous stroke in October 2014 was brought to the ER but patient's sister noted that patient has been having difficulty walking due to weakness in the left lower extremity.  CT of the head was done which did not show anything acute followed by MRI of the brain which shows acute on chronic small vessel ischemia in the right corona radiata and right caudate nucleus. No associated mass effect or hemorrhage.  Denies any difficulty talking or swallowing or any headaches or visual symptoms.  CXR No active cardiopulmonary disease.    Assessment / Plan / Recommendation Clinical Impression  Pt. demonstrating evidence of pharyngeal dysphagia suspecting delayed swallow initiation, residuals decreased airway protection.  MBS scheduled today at 0930 for full evaluation with safest recommendations.    Aspiration Risk  Moderate    Diet Recommendation NPO        Other  Recommendations Recommended Consults: MBS Oral Care Recommendations: Oral care BID   Follow Up Recommendations   (TBD)    Frequency and Duration        Pertinent Vitals/Pain WDL         Swallow Study         Oral/Motor/Sensory Function Overall Oral Motor/Sensory Function: Impaired Labial ROM: Reduced left Labial Symmetry: Abnormal symmetry left Labial Strength: Reduced Lingual ROM: Reduced left Lingual Symmetry: Abnormal symmetry left Lingual Strength: Reduced Facial ROM: Reduced left Facial Symmetry: Left droop Facial Strength:  Reduced Velum: Within Functional Limits Mandible: Within Functional Limits   Ice Chips Ice chips: Not tested   Thin Liquid Thin Liquid: Impaired Presentation: Cup;Straw Oral Phase Impairments: Reduced labial seal Oral Phase Functional Implications: Right anterior spillage Pharyngeal  Phase Impairments: Suspected delayed Swallow;Cough - Immediate    Nectar Thick Nectar Thick Liquid: Not tested   Honey Thick Honey Thick Liquid: Not tested   Puree Puree: Impaired Presentation: Spoon Oral Phase Functional Implications: Other (comment) (labial residue) Pharyngeal Phase Impairments: Wet Vocal Quality   Solid       Solid: Not tested       Benjamin MacadamiaLisa Willis Denilson [name] M.Ed [employer]SL[employer] Pager [phone]  07/18/2013

## 2013-07-18 NOTE — Evaluation (Signed)
**Note Benjamin-Identified via Obfuscation** Clinical/Bedside Swallow Evaluation Patient Details  Name: Benjamin [name] MRN: [id number] Date of Birth: 1950-06-28  Today's Date: 07/18/2013 Time: [id number] SLP Time Calculation (min): 9 min  Past Medical History:  Past Medical History  Diagnosis Date  . Stroke    Past Surgical History:  Past Surgical History  Procedure Laterality Date  . Other surgical history      Stent, unknown  . Incision and drainage      back wound  . Back surgery     HPI:  63 y.o. male with history of previous stroke in October 2014 was brought to the ER but patient's sister noted that patient has been having difficulty walking due to weakness in the left lower extremity.  CT of the head was done which did not show anything acute followed by MRI of the brain which shows acute on chronic small vessel ischemia in the right corona radiata and right caudate nucleus. No associated mass effect or hemorrhage.  Denies any difficulty talking or swallowing or any headaches or visual symptoms.  CXR No active cardiopulmonary disease.    Assessment / Plan / Recommendation Clinical Impression  Pt. exhibits moderate dysarthria with decreased respiratory support at conversational level, phoneme distortions and decreased vocal intensity which pt. reports has worsened from previous stroke.  He demonstrated cognitive difficulties with mildly delayed responses and suspect higer level challenges.  ST will investigate premorbid cognitive abilities.         Aspiration Risk  Moderate    Diet Recommendation NPO        Other  Recommendations Recommended Consults: MBS Oral Care Recommendations: Oral care BID   Follow Up Recommendations   (TBD)    Frequency and Duration min 2x/week      Pertinent Vitals/Pain WDL         Swallow Study Prior Functional Status  Cognitive/Linguistic Baseline: Information not available (previous CVA, uncertain of cognitive impact)  Lives With:  ([place]/ [place] since [place],  sisters check on him)    General HPI: 63 y.o. male with history of previous stroke in October 2014 was brought to the ER but patient's sister noted that patient has been having difficulty walking due to weakness in the left lower extremity.  CT of the head was done which did not show anything acute followed by MRI of the brain which shows acute on chronic small vessel ischemia in the right corona radiata and right caudate nucleus. No associated mass effect or hemorrhage.  Denies any difficulty talking or swallowing or any headaches or visual symptoms.  CXR No active cardiopulmonary disease.  Type of Study: Bedside swallow evaluation Previous Swallow Assessment:  (none) Diet Prior to this Study: NPO Temperature Spikes Noted: No Respiratory Status: Room air History of Recent Intubation: No Behavior/Cognition: Alert;Cooperative;Pleasant mood Oral Cavity - Dentition:  (missing 20%) Self-Feeding Abilities: Able to feed self;Needs assist;Needs set up Patient Positioning: Upright in bed Baseline Vocal Quality: Low vocal intensity;Hoarse Volitional Cough: Strong Volitional Swallow: Able to elicit    Oral/Motor/Sensory Function Overall Oral Motor/Sensory Function: Impaired Labial ROM: Reduced left Labial Symmetry: Abnormal symmetry left Labial Strength: Reduced Lingual ROM: Reduced left Lingual Symmetry: Abnormal symmetry left Lingual Strength: Reduced Facial ROM: Reduced left Facial Symmetry: Left droop Facial Strength: Reduced Velum: Within Functional Limits Mandible: Within Functional Limits   Ice Chips Ice chips: Not tested   Thin Liquid Thin Liquid: Impaired Presentation: Cup;Straw Oral Phase Impairments: Reduced labial seal Oral Phase Functional Implications: Right anterior spillage Pharyngeal  Phase Impairments: Suspected delayed Swallow;Cough - Immediate    Nectar Thick Nectar Thick Liquid: Not tested   Honey Thick Honey Thick Liquid: Not tested   Puree Puree:  Impaired Presentation: Spoon Oral Phase Functional Implications: Other (comment) (labial residue) Pharyngeal Phase Impairments: Wet Vocal Quality   Solid   GO    Solid: Not tested       Royce Macadamia M.Ed [employer] [phone]  07/18/2013

## 2013-07-18 NOTE — Progress Notes (Signed)
**Note Benjamin-Identified via Obfuscation**    CARE MANAGEMENT NOTE 07/18/2013  Patient:  Benjamin [name],Benjamin [name]   Account Number:  [id number]  Date Initiated:  07/18/2013  Documentation initiated by:  Jiles CrockerHANDLER,Mechelle Pates  Subjective/Objective Assessment:   ADMITTED FOR STROKE WORKUP  PCP IS DR Dartha LodgeANTHONY STEELE     Action/Plan:   CM FOLLOWING FOR DCP   Anticipated DC Date:  07/23/2013   Anticipated DC Plan:  POSSIBLY HOME W HOME HEALTH SERVICES     DC Planning Services  CM consult          Status of service:  In process, will continue to follow Medicare Important Message given?  NA - LOS <3 / Initial given by admissions (If response is "NO", the following Medicare IM given date fields will be blank)  Per UR Regulation:  Reviewed for med. necessity/level of care/duration of stay  Comments:  3/26/2015Abelino Derrick- B Andalyn Heckstall RN,BSN,MHA [phone]

## 2013-07-18 NOTE — Progress Notes (Signed)
**Note Benjamin-Identified via Obfuscation** Stroke Team Progress Note  HISTORY Benjamin [name] is a 63 y.o. male who reported that he had a stroke in October of last year that affected his left side. He was ambulating with a walker. He noted that when he awakened the morning of 07/18/2013 he was unable to get to the bathroom with his walker like he usually does and felt that he was weaker on the left. He presented at that time for evaluation.  Patient was on an aspirin a day at home for stroke prophylaxis.   Date last known well: Date: 07/16/2013  Time last known well: Time: 21:00  tPA Given: No: Outside time window   SUBJECTIVE There are no family members present. The patient continues to have left lower extremity weakness. He has been cleared by speech for dysphagia 1 diet with pudding thick liquids.  OBJECTIVE Most recent Vital Signs: Filed Vitals:   07/18/13 0113 07/18/13 0300 07/18/13 0510 07/18/13 0701  BP: 114/62 125/59 121/64 136/67  Pulse: 88 91 86 92  Temp: 98.6 F (37 C) 98.4 F (36.9 C) 98.3 F (36.8 C) 97.8 F (36.6 C)  TempSrc: Oral Oral Oral Oral  Resp: 16 16 16 16   Height:      Weight:      SpO2: 100% 100% 98% 100%   CBG (last 3)  No results found for this basename: GLUCAP,  in the last 72 hours  IV Fluid Intake:   . sodium chloride 125 mL/hr at 07/18/13 0547    MEDICATIONS  . aspirin  300 mg Rectal Daily   Or  . aspirin  325 mg Oral Daily  . enoxaparin (LOVENOX) injection  40 mg Subcutaneous Daily  . vancomycin  750 mg Intravenous Q12H   PRN:  senna-docusate  Diet:  Dysphagia 1 diet with pudding thick liquids Activity:  Up with assistance DVT Prophylaxis:  Lovenox  CLINICALLY SIGNIFICANT STUDIES Basic Metabolic Panel:  Recent Labs Lab 07/17/13 1327 07/18/13 0356  NA 138 141  K 4.2 3.9  CL 94* 104  CO2 26 25  GLUCOSE 135* 95  BUN 32* 24*  CREATININE 0.95 0.73  CALCIUM 10.2 9.0   Liver Function Tests:  Recent Labs Lab 07/17/13 1327 07/18/13 0356  AST 59* 47*  ALT 27 19   ALKPHOS 159* 121*  BILITOT 0.7 0.6  PROT 8.4* 6.4  ALBUMIN 3.2* 2.6*   CBC:  Recent Labs Lab 07/17/13 1327 07/18/13 0356  WBC 11.9* 7.6  NEUTROABS  --  5.5  HGB 14.3 11.1*  HCT 42.2 33.8*  MCV 89.8 90.1  PLT 168 156   Coagulation: No results found for this basename: LABPROT, INR,  in the last 168 hours Cardiac Enzymes:  Recent Labs Lab 07/18/13 0356  TROPONINI <0.30   Urinalysis:  Recent Labs Lab 07/17/13 1542  COLORURINE AMBER*  LABSPEC 1.030  PHURINE 6.0  GLUCOSEU NEGATIVE  HGBUR NEGATIVE  BILIRUBINUR SMALL*  KETONESUR NEGATIVE  PROTEINUR NEGATIVE  UROBILINOGEN 1.0  NITRITE NEGATIVE  LEUKOCYTESUR TRACE*   Lipid Panel    Component Value Date/Time   CHOL 141 07/18/2013 0356   TRIG 83 07/18/2013 0356   HDL 32* 07/18/2013 0356   CHOLHDL 4.4 07/18/2013 0356   VLDL 17 07/18/2013 0356   LDLCALC 92 07/18/2013 0356   HgbA1C  Lab Results  Component Value Date   HGBA1C 5.5 02/17/2013    Urine Drug Screen:     Component Value Date/Time   LABOPIA NONE DETECTED 02/16/2013 2207   COCAINSCRNUR NONE DETECTED 02/16/2013  2207   LABBENZ NONE DETECTED 02/16/2013 2207   AMPHETMU NONE DETECTED 02/16/2013 2207   THCU NONE DETECTED 02/16/2013 2207   LABBARB NONE DETECTED 02/16/2013 2207    Alcohol Level: No results found for this basename: ETH,  in the last 168 hours  Dg Chest 2 View 07/17/2013    No active cardiopulmonary disease.    Ct Head Wo Contrast 07/17/2013    Chronic changes as described. No definite acute ischemia or hemorrhage.      [name] Head Wo Contrast 07/17/2013    1. Acute on chronic small vessel ischemia in the right corona radiata and right caudate nucleus. No associated mass effect or hemorrhage.  2. Stable and negative intracranial MRA except for relatively mild large vessel atherosclerosis.  3. Chronic but increased paranasal sinus disease on the right.     2D Echocardiogram  pending  Carotid Doppler  pending  EKG  Sinus tachycardia rate  121 beats per minute. For complete results please see formal report.   Therapy Recommendations pending  Physical Exam   Neurologic Examination:  Mental Status:  Alert, oriented, thought content appropriate. Speech fluent but dysarthric. Able to follow 3 step commands without difficulty.  Cranial Nerves:  II: Discs flat bilaterally; Visual fields grossly normal, pupils equal, round, reactive to light and accommodation  III,IV, VI: ptosis not present, extra-ocular motions intact bilaterally  V,VII: left facial droop, facial light touch sensation normal bilaterally  VIII: hearing normal bilaterally  IX,X: gag reflex present  XI: bilateral shoulder shrug  XII: tongue deviation to the left  Motor:  Right : Upper extremity 5/5 Left: Upper extremity 4+/5  Lower extremity 5/5 Lower extremity 4/5  Tone and bulk:normal tone throughout; no atrophy noted  Sensory: Pinprick and light touch intact throughout, bilaterally  Deep Tendon Reflexes: 2+ and symmetric throughout  Plantars:  Right: mute Left: upgoing  Cerebellar:  normal finger-to-nose and normal heel-to-shin test  Gait: unable to test   ASSESSMENT [name] is a 63 y.o. male presenting with increased weakness on the left with previous right CVA.. TPA was not administered secondary to late presentation. MRI/MRA -acute on chronic small vessel ischemia in the right corona radiata and right caudate nucleus. On aspirin 325 mg orally every day prior to admission. Now on aspirin 325 mg orally every day for secondary stroke prevention. Patient with resultant left hemiparesis. Stroke work up underway.   Previous stroke  Mild anemia  Mildly elevated liver function tests  Hemoglobin A1c 5.5  Cholesterol 141 LDL 92  Possible pericarditis by computer reading of EKG. Await echo.   Hospital day # 1  TREATMENT/PLAN  Change aspirin to Plavix for secondary stroke prevention now the patient can take POs.  Await carotid Dopplers  and 2-D echo.  Await therapy evaluations.    Delton See PA-C [facility] Pager [phone] 07/18/2013, 8:40 AM  I have personally obtained a history, examined the patient, evaluated imaging results, and formulated the assessment and plan of care. I agree with the above.  Delia Heady, MD  To contact Stroke Continuity provider, please refer to [email or web address]. After hours, contact General Neurology

## 2013-07-18 NOTE — Progress Notes (Signed)
VASCULAR LAB PRELIMINARY  PRELIMINARY  PRELIMINARY  PRELIMINARY  Carotid Dopplers completed.    Preliminary report:  1-39% ICA stenosis.  Vertebral artery flow is antegrade.  Essica Kiker, RVT 07/18/2013, 12:08 PM

## 2013-07-18 NOTE — Progress Notes (Signed)
[facility] PROGRESS NOTE  Benjamin [name] BMW:[id number] DOB: [birth year] DOA: 07/17/2013 PCP: Dartha Lodge, FNP  Assessment/Plan: 1. Acute CVA. Patient presenting with left-sided weakness, did not receive TPA on admission. MRI of brain revealing an acute on chronic small vessel changes in the right corona radiata and right caudate nucleus. Undergoing CVA workup. As it appears that this presents aspirin failure, he was changed to Plavix 75 mg by mouth daily. 2. Dysphasia. Patient failing swallowing evaluation for speech pathology is recommended modified barium study. Await further recommendations from speech path. 3. Questions to let us of left lower extremity. Patient was started on empiric antimicrobial therapy with vancomycin IV. 4. DVT prophylaxis. Lovenox  Code Status: Full Code Family Communication:  Disposition Plan: SW consult for SNF vs CIR placement   Consultants:  Neurology  Procedures:    Antibiotics:  Vancomycin  HPI/Subjective: Patient is a 63 year old gentleman with a past medical history of CVA in October of 2014, admitted to the medicine service on 07/17/2013, presenting with left upper left lower extremity weakness as well as left-sided facial droop. He had been on aspirin 325 mg by mouth daily prior to this hospitalization. Initial CT scan of brain did not reveal acute intracranial abnormality. Neurology consulted, had a followup MRI of the brain which revealed acute on chronic small vessels came in the right corona radiata and right caudate nucleus. Patient reports no improvement to neurological deficits this morning. He did fail swallow screen for which speech pathology recommended MBS.  Objective: Filed Vitals:   07/18/13 1409  BP: 114/59  Pulse: 81  Temp: 98 F (36.7 C)  Resp: 16    Intake/Output Summary (Last 24 hours) at 07/18/13 1616 Last data filed at 07/18/13 0800  Gross per 24 hour  Intake 902.09 ml  Output      0 ml  Net 902.09 ml    Filed Weights   07/17/13 1700  Weight: 68.04 kg (150 lb)    Exam:   General:  No acute distress, left-sided facial droop noted, slurred speech evident.  Cardiovascular: Regular rate and rhythm normal S1 is  Respiratory: Lungs are clear to auscultation bilaterally no wheezing rhonchi or rales  Abdomen: Soft nontender no side  Musculoskeletal: No edema  Neurological: Patient having 2-5 muscle strength in left upper left lower extremity, left-sided facial droop, some slurred speech evident. No alteration to sensation bilaterally.  Data Reviewed: Basic Metabolic Panel:  Recent Labs Lab 07/17/13 1327 07/18/13 0356  NA 138 141  K 4.2 3.9  CL 94* 104  CO2 26 25  GLUCOSE 135* 95  BUN 32* 24*  CREATININE 0.95 0.73  CALCIUM 10.2 9.0   Liver Function Tests:  Recent Labs Lab 07/17/13 1327 07/18/13 0356  AST 59* 47*  ALT 27 19  ALKPHOS 159* 121*  BILITOT 0.7 0.6  PROT 8.4* 6.4  ALBUMIN 3.2* 2.6*   No results found for this basename: LIPASE, AMYLASE,  in the last 168 hours No results found for this basename: AMMONIA,  in the last 168 hours CBC:  Recent Labs Lab 07/17/13 1327 07/18/13 0356  WBC 11.9* 7.6  NEUTROABS  --  5.5  HGB 14.3 11.1*  HCT 42.2 33.8*  MCV 89.8 90.1  PLT 168 156   Cardiac Enzymes:  Recent Labs Lab 07/18/13 0356  TROPONINI <0.30   BNP (last 3 results) No results found for this basename: PROBNP,  in the last 8760 hours CBG: No results found for this basename: GLUCAP,  in the last 168  hours  Recent Results (from the past 240 hour(s))  CULTURE, BLOOD (ROUTINE X 2)     Status: None   Collection Time    07/17/13  2:42 PM      Result Value Ref Range Status   Specimen Description BLOOD ARM LEFT   Final   Special Requests BOTTLES DRAWN AEROBIC AND ANAEROBIC 5CC   Final   Culture  Setup Time     Final   Value: 07/17/2013 19:18     Performed at [facility]   Culture     Final   Value:        BLOOD CULTURE RECEIVED NO  GROWTH TO DATE CULTURE WILL BE HELD FOR 5 DAYS BEFORE ISSUING A FINAL NEGATIVE REPORT     Performed at [facility]   Report Status PENDING   Incomplete  CULTURE, BLOOD (ROUTINE X 2)     Status: None   Collection Time    07/17/13  3:00 PM      Result Value Ref Range Status   Specimen Description BLOOD HAND LEFT   Final   Special Requests BOTTLES DRAWN AEROBIC ONLY 5CC   Final   Culture  Setup Time     Final   Value: 07/17/2013 19:18     Performed at [facility]   Culture     Final   Value:        BLOOD CULTURE RECEIVED NO GROWTH TO DATE CULTURE WILL BE HELD FOR 5 DAYS BEFORE ISSUING A FINAL NEGATIVE REPORT     Performed at [facility]   Report Status PENDING   Incomplete  URINE CULTURE     Status: None   Collection Time    07/17/13  3:42 PM      Result Value Ref Range Status   Specimen Description URINE, RANDOM   Final   Special Requests NONE   Final   Culture  Setup Time     Final   Value: 07/17/2013 19:35     Performed at [facility] Count     Final   Value: NO GROWTH     Performed at [facility]   Culture     Final   Value: NO GROWTH     Performed at [facility]   Report Status 07/18/2013 FINAL   Final     Studies: Dg Chest 2 View  07/17/2013   CLINICAL DATA:  Weakness.  Fell today.  EXAM: CHEST  2 VIEW  COMPARISON:  02/16/2013  FINDINGS: The heart size and mediastinal contours are within normal limits. Both lungs are clear. The visualized skeletal structures are unremarkable.  IMPRESSION: No active cardiopulmonary disease.   Electronically Signed   By: Amie Portland M.D.   On: 07/17/2013 17:23   Ct Head Wo Contrast  07/17/2013   CLINICAL DATA:  Generalized weakness.  EXAM: CT HEAD WITHOUT CONTRAST  TECHNIQUE: Contiguous axial images were obtained from the base of the skull through the vertex without contrast.  COMPARISON:  MR HEAD W/O CM dated 02/17/2013; MR MRA HEAD W/O CM dated 02/17/2013; CT HEAD W/O CM dated  02/16/2013  FINDINGS: Resolved right basal ganglia infarction with gliosis and encephalomalacia. Generalized atrophy. Chronic microvascular ischemic change. Scattered chronic lacunar infarctions elsewhere throughout the white matter. Remote bilateral right greater than left cerebellar infarcts.  No definite acute cortical infarction, acute hemorrhage, mass lesion, hydrocephalus, or extra-axial fluid. Calvarium intact. Moderately advanced maxillary and ethmoid sinus disease,  worse on the right. No mastoid fluid. Vascular calcification.  IMPRESSION: Chronic changes as described. No definite acute ischemia or hemorrhage.   Electronically Signed   By: Davonna Belling M.D.   On: 07/17/2013 15:08   [name] Head Wo Contrast  07/17/2013   CLINICAL DATA:  63 year old male generalized weakness, increasing since 07/09/2013. Initial encounter.  EXAM: MRI HEAD WITHOUT CONTRAST  MRA HEAD WITHOUT CONTRAST  TECHNIQUE: Multiplanar, multiecho pulse sequences of the brain and surrounding structures were obtained without intravenous contrast. Angiographic images of the head were obtained using MRA technique without contrast.  COMPARISON:  Head CT without contrast 07/17/2013. Brain MRI and MRA 02/17/2013.  FINDINGS: MRI HEAD FINDINGS  New confluent 3 cm area of restricted diffusion in the right corona radiata (series 4, image 21). Superimposed recurrent right caudate nucleus restricted diffusion, now along the inferior aspect of the caudate head (inferior to the lesion in October).  No contralateral left hemisphere or posterior fossa restricted diffusion. Major intracranial vascular flow voids are stable.  Underlying multifocal chronic (mostly lacunar) infarcts in the bilateral deep gray matter nuclei, the brainstem,, and bilateral cerebellum. Patchy and confluent underlying cerebral white matter T2 and FLAIR hyperintensity not significantly changed outside of the acute findings. Stable cerebral volume. No ventriculomegaly. Occasional  chronic micro hemorrhages in the brain are stable. No acute intracranial hemorrhage identified. Negative pituitary and cervicomedullary junction. Stable visualized cervical spine. Stable bone marrow signal.  Chronic paranasal sinus inflammatory changes and opacification have progressed on the right side. Stable left maxillary sinus mucous retention cyst. Mastoids remain clear. Visualized orbit soft tissues are within normal limits. Visible internal auditory structures appear normal. Visualized scalp soft tissues are within normal limits.  MRA HEAD FINDINGS  Stable antegrade flow in the posterior circulation. Patent vertebrobasilar junction with no distal vertebral artery stenosis. No basilar artery stenosis. Patent AICA origins. SCA and PCA origins are normal. Posterior communicating arteries are normal. Bilateral PCA branches are stable, with mild irregularity of the right P1 segment. Preserved distal PCA flow.  Stable antegrade flow in both ICA siphons. ICA siphon irregularity in keeping with atherosclerosis but no ICA stenosis. Ophthalmic and posterior communicating artery origins are normal. Stable carotid termini. Stable MCA and ACA origins. Tortuous but otherwise negative visualized ACA is. Anterior communicating artery diminutive or absent. Left MCA branches stable and within normal limits.  Right MCA M1 segment is stable and within normal limits. Right MCA trifurcation is stable and within normal limits. No right MCA branch occlusion is identified.  IMPRESSION: 1. Acute on chronic small vessel ischemia in the right corona radiata and right caudate nucleus. No associated mass effect or hemorrhage. 2. Stable and negative intracranial MRA except for relatively mild large vessel atherosclerosis. 3. Chronic but increased paranasal sinus disease on the right.   Electronically Signed   By: Augusto Gamble M.D.   On: 07/17/2013 20:41   Mr Brain Wo Contrast  07/17/2013   CLINICAL DATA:  63 year old male generalized  weakness, increasing since 07/09/2013. Initial encounter.  EXAM: MRI HEAD WITHOUT CONTRAST  MRA HEAD WITHOUT CONTRAST  TECHNIQUE: Multiplanar, multiecho pulse sequences of the brain and surrounding structures were obtained without intravenous contrast. Angiographic images of the head were obtained using MRA technique without contrast.  COMPARISON:  Head CT without contrast 07/17/2013. Brain MRI and MRA 02/17/2013.  FINDINGS: MRI HEAD FINDINGS  New confluent 3 cm area of restricted diffusion in the right corona radiata (series 4, image 21). Superimposed recurrent right caudate nucleus restricted diffusion,  now along the inferior aspect of the caudate head (inferior to the lesion in October).  No contralateral left hemisphere or posterior fossa restricted diffusion. Major intracranial vascular flow voids are stable.  Underlying multifocal chronic (mostly lacunar) infarcts in the bilateral deep gray matter nuclei, the brainstem,, and bilateral cerebellum. Patchy and confluent underlying cerebral white matter T2 and FLAIR hyperintensity not significantly changed outside of the acute findings. Stable cerebral volume. No ventriculomegaly. Occasional chronic micro hemorrhages in the brain are stable. No acute intracranial hemorrhage identified. Negative pituitary and cervicomedullary junction. Stable visualized cervical spine. Stable bone marrow signal.  Chronic paranasal sinus inflammatory changes and opacification have progressed on the right side. Stable left maxillary sinus mucous retention cyst. Mastoids remain clear. Visualized orbit soft tissues are within normal limits. Visible internal auditory structures appear normal. Visualized scalp soft tissues are within normal limits.  MRA HEAD FINDINGS  Stable antegrade flow in the posterior circulation. Patent vertebrobasilar junction with no distal vertebral artery stenosis. No basilar artery stenosis. Patent AICA origins. SCA and PCA origins are normal. Posterior  communicating arteries are normal. Bilateral PCA branches are stable, with mild irregularity of the right P1 segment. Preserved distal PCA flow.  Stable antegrade flow in both ICA siphons. ICA siphon irregularity in keeping with atherosclerosis but no ICA stenosis. Ophthalmic and posterior communicating artery origins are normal. Stable carotid termini. Stable MCA and ACA origins. Tortuous but otherwise negative visualized ACA is. Anterior communicating artery diminutive or absent. Left MCA branches stable and within normal limits.  Right MCA M1 segment is stable and within normal limits. Right MCA trifurcation is stable and within normal limits. No right MCA branch occlusion is identified.  IMPRESSION: 1. Acute on chronic small vessel ischemia in the right corona radiata and right caudate nucleus. No associated mass effect or hemorrhage. 2. Stable and negative intracranial MRA except for relatively mild large vessel atherosclerosis. 3. Chronic but increased paranasal sinus disease on the right.   Electronically Signed   By: Augusto Gamble M.D.   On: 07/17/2013 20:41   Dg Swallowing Func-speech Pathology  07/18/2013   Breck Coons Stephan, CCC-SLP     07/18/2013 11:03 AM Objective Swallowing Evaluation: Modified Barium Swallowing Study   Patient Details  Name: Benjamin [name] MRN: [id number] Date of Birth: 04/30/1950  Today's Date: 07/18/2013 Time: 4098-1191 SLP Time Calculation (min): 20 min  Past Medical History:  Past Medical History  Diagnosis Date  . Stroke    Past Surgical History:  Past Surgical History  Procedure Laterality Date  . Other surgical history      Stent, unknown  . Incision and drainage      back wound  . Back surgery     HPI:  64 y.o. male with history of previous stroke in October 2014 was  brought to the ER but patient's sister noted that patient has  been having difficulty walking due to weakness in the left lower  extremity.  CT of the head was done which did not show anything  acute followed by  MRI of the brain which shows acute on chronic  small vessel ischemia in the right corona radiata and right  caudate nucleus. No associated mass effect or hemorrhage.  Denies  any difficulty talking or swallowing or any headaches or visual  symptoms.  CXR No active cardiopulmonary disease.      Assessment / Plan / Recommendation Clinical Impression  Dysphagia Diagnosis: Mild oral phase dysphagia;Moderate oral  phase dysphagia;Severe pharyngeal phase dysphagia;Moderate  pharyngeal phase dysphagia Clinical impression: Pt. exhibits a mild-moderate oropharyngeal  dysphagia with lingual residue caused by decreased strength.   Pharyngeal deficits comprised of both sensory and motor  components leading to frank silent aspiration of nectar and honey  consistencies.  Volitional cough cleared a portion of  aspirates/penetrates.  Therapy techniques attempted such as chin  tuck, multiple swallows and hard cough with chin tuck  ineffective. Significant aspiration risk with any consistency  thinner than pudding/puree.  Recommend Dys 1 texture and PUDDING  thick liquids, 2 swallows and hard cough, crush meds.  ST will  follow for safety and trial of pharyngeal strengthening exercies  to facilitate muscle weakness.decreased ROM.     Treatment Recommendation  Therapy as outlined in treatment plan below    Diet Recommendation Dysphagia 1 (Puree);Pudding-thick liquid;No  liquids   Liquid Administration via: Spoon Medication Administration: Crushed with puree Supervision: Patient able to self feed;Full supervision/cueing  for compensatory strategies Compensations: Slow rate;Small sips/bites;Multiple dry swallows  after each bite/sip;Hard cough after swallow (cough after 3  bites/sips) Postural Changes and/or Swallow Maneuvers: Seated upright 90  degrees;Upright 30-60 min after meal    Other  Recommendations Recommended Consults: MBS Oral Care Recommendations: Oral care BID   Follow Up Recommendations  Inpatient Rehab    Frequency and  Duration min 2x/week  2 weeks   Pertinent Vitals/Pain WDL            Reason for Referral Objectively evaluate swallowing function   Oral Phase Oral Preparation/Oral Phase Oral Phase: Impaired Oral - Honey Oral - Honey Cup: Lingual/palatal residue Oral - Nectar Oral - Nectar Teaspoon: Lingual/palatal residue Oral - Nectar Cup: Lingual/palatal residue Oral - Solids Oral - Puree: Lingual/palatal residue (min)   Pharyngeal Phase Pharyngeal Phase Pharyngeal Phase: Impaired Pharyngeal - Honey Pharyngeal - Honey Teaspoon: Delayed swallow initiation;Premature  spillage to valleculae;Pharyngeal residue - valleculae;Reduced  tongue base retraction Pharyngeal - Honey Cup: Delayed swallow initiation;Premature  spillage to valleculae;Penetration/Aspiration during  swallow;Pharyngeal residue - valleculae;Pharyngeal residue -  pyriform sinuses;Reduced laryngeal elevation;Reduced tongue base  retraction;Reduced airway/laryngeal closure Penetration/Aspiration details (honey cup): Material enters  airway, remains ABOVE vocal cords then ejected out;Material  enters airway, passes BELOW cords without attempt by patient to  eject out (silent aspiration) Pharyngeal - Nectar Pharyngeal - Nectar Teaspoon: Delayed swallow  initiation;Premature spillage to pyriform sinuses;Pharyngeal  residue - valleculae;Pharyngeal residue - pyriform  sinuses;Reduced tongue base retraction;Reduced laryngeal  elevation;Reduced airway/laryngeal closure;Reduced anterior  laryngeal mobility Pharyngeal - Nectar Cup: Delayed swallow initiation;Premature  spillage to pyriform sinuses;Reduced anterior laryngeal  mobility;Reduced laryngeal elevation;Reduced airway/laryngeal  closure;Reduced tongue base retraction;Penetration/Aspiration  during swallow;Pharyngeal residue - pyriform sinuses;Pharyngeal  residue - valleculae;Significant aspiration (Amount) Pharyngeal - Solids Pharyngeal - Puree: Delayed swallow initiation;Premature spillage  to pyriform  sinuses;Pharyngeal residue - valleculae;Reduced  tongue base retraction  Cervical Esophageal Phase    GO    Cervical Esophageal Phase Cervical Esophageal Phase: [facility]         Darrow Bussing.Ed CCC-SLP Pager [phone]  07/18/2013    Scheduled Meds: . clopidogrel  75 mg Oral Q breakfast  . enoxaparin (LOVENOX) injection  40 mg Subcutaneous Daily  . vancomycin  1,000 mg Intravenous Q12H   Continuous Infusions: . sodium chloride 125 mL/hr at 07/18/13 0547    Principal Problem:   CVA (cerebral infarction) Active Problems:   B12 deficiency   Cellulitis    Time spent: 35 min    Jeralyn Bennett  [facility] Pager [phone]. If 7PM-7AM, please contact  night-coverage at [email or web address], password [email or web address] 07/18/2013, 4:16 PM  LOS: 1 day

## 2013-07-18 NOTE — Progress Notes (Signed)
Echocardiogram 2D Echocardiogram has been performed.  Benjamin [name] 07/18/2013, 10:55 AM

## 2013-07-18 NOTE — Progress Notes (Signed)
**Note Benjamin-Identified via Obfuscation** PT Cancellation Note  Patient Details Name: Benjamin [name] MRN: [id number] DOB: 11-Feb-1951   Cancelled Treatment:    Reason Eval/Treat Not Completed: Patient at procedure or test/unavailable   Fabio AsaWerner, Azelia Reiger J 07/18/2013, 8:53 AM Charlotte Crumbevon Juandavid Dallman, PT DPT  [phone]

## 2013-07-18 NOTE — Consult Note (Signed)
Referring Physician: Toniann Fail    Chief Complaint: Weakness  HPI: Benjamin [name] is an 63 y.o. male who reports that he had a stroke in October of last year that affected his left side.  He was ambulating with a walker.  He noted that when he awakened this morning he was unable to get to the bathroom with his walker like he usually does and felt that he was weaker on the left.  He presented at that time for evaluation.   Patient was on an aspirin a day at home for stroke prophylaxis.    Date last known well: Date: 07/16/2013 Time last known well: Time: 21:00 tPA Given: No: Outside time window  Past Medical History  Diagnosis Date  . Stroke     Past Surgical History  Procedure Laterality Date  . Other surgical history      Stent, unknown  . Incision and drainage      back wound  . Back surgery      Family History  Problem Relation Age of Onset  . Diabetes Mellitus II Mother   . Stroke Brother    Social History:  reports that he has quit smoking. His smoking use included Cigarettes. He smoked 0.00 packs per day for .5 years. He has never used smokeless tobacco. He reports that he does not drink alcohol or use illicit drugs.  Allergies: No Known Allergies  Medications:  I have reviewed the patient's current medications. Prior to Admission:  Prescriptions prior to admission  Medication Sig Dispense Refill  . aspirin 325 MG tablet Take 1 tablet (325 mg total) by mouth daily.  30 tablet  0  . furosemide (LASIX) 20 MG tablet Take 20 mg by mouth.       Scheduled: . aspirin  300 mg Rectal Once  . aspirin  300 mg Rectal Daily   Or  . aspirin  325 mg Oral Daily  . enoxaparin (LOVENOX) injection  40 mg Subcutaneous Daily  . vancomycin  750 mg Intravenous Q12H    ROS: History obtained from the patient  General ROS: negative for - chills, fatigue, fever, night sweats, weight gain or weight loss Psychological ROS: negative for - behavioral disorder, hallucinations, memory  difficulties, mood swings or suicidal ideation Ophthalmic ROS: negative for - blurry vision, double vision, eye pain or loss of vision ENT ROS: negative for - epistaxis, nasal discharge, oral lesions, sore throat, tinnitus or vertigo Allergy and Immunology ROS: negative for - hives or itchy/watery eyes Hematological and Lymphatic ROS: negative for - bleeding problems, bruising or swollen lymph nodes Endocrine ROS: negative for - galactorrhea, hair pattern changes, polydipsia/polyuria or temperature intolerance Respiratory ROS: negative for - cough, hemoptysis, shortness of breath or wheezing Cardiovascular ROS: negative for - chest pain, dyspnea on exertion, edema or irregular heartbeat Gastrointestinal ROS: negative for - abdominal pain, diarrhea, hematemesis, nausea/vomiting or stool incontinence Genito-Urinary ROS: negative for - dysuria, hematuria, incontinence or urinary frequency/urgency Musculoskeletal ROS: negative for - joint swelling or muscular weakness Neurological ROS: as noted in HPI Dermatological ROS: skin changes on the lower left leg  Physical Examination: Blood pressure 128/70, pulse 89, temperature 98.3 F (36.8 C), temperature source Oral, resp. rate 18, height 5\' 10"  (1.778 m), weight 68.04 kg (150 lb), SpO2 100.00%.  Neurologic Examination: Mental Status: Alert, oriented, thought content appropriate.  Speech fluent but dysarthric.  Able to follow 3 step commands without difficulty. Cranial Nerves: II: Discs flat bilaterally; Visual fields grossly normal, pupils equal, round, reactive  to light and accommodation III,IV, VI: ptosis not present, extra-ocular motions intact bilaterally V,VII: left facial droop, facial light touch sensation normal bilaterally VIII: hearing normal bilaterally IX,X: gag reflex present XI: bilateral shoulder shrug XII: tongue deviation to the left Motor: Right : Upper extremity   5/5    Left:     Upper extremity   4+/5  Lower extremity    5/5     Lower extremity   4/5 Tone and bulk:normal tone throughout; no atrophy noted Sensory: Pinprick and light touch intact throughout, bilaterally Deep Tendon Reflexes: 2+ and symmetric throughout Plantars: Right: mute   Left: upgoing Cerebellar: normal finger-to-nose and normal heel-to-shin test Gait: unable to test CV: pulses palpable throughout     Laboratory Studies:  Basic Metabolic Panel:  Recent Labs Lab 07/17/13 1327  NA 138  K 4.2  CL 94*  CO2 26  GLUCOSE 135*  BUN 32*  CREATININE 0.95  CALCIUM 10.2    Liver Function Tests:  Recent Labs Lab 07/17/13 1327  AST 59*  ALT 27  ALKPHOS 159*  BILITOT 0.7  PROT 8.4*  ALBUMIN 3.2*   No results found for this basename: LIPASE, AMYLASE,  in the last 168 hours No results found for this basename: AMMONIA,  in the last 168 hours  CBC:  Recent Labs Lab 07/17/13 1327  WBC 11.9*  HGB 14.3  HCT 42.2  MCV 89.8  PLT 168    Cardiac Enzymes: No results found for this basename: CKTOTAL, CKMB, CKMBINDEX, TROPONINI,  in the last 168 hours  BNP: No components found with this basename: POCBNP,   CBG: No results found for this basename: GLUCAP,  in the last 168 hours  Microbiology: Results for orders placed during the hospital encounter of 02/16/13  URINE CULTURE     Status: None   Collection Time    02/16/13 10:07 PM      Result Value Ref Range Status   Specimen Description URINE, RANDOM   Final   Special Requests NONE   Final   Culture  Setup Time     Final   Value: 02/17/2013 05:59     Performed at [facility] Count     Final   Value: 30,000 COLONIES/ML     Performed at [facility]   Culture     Final   Value: Multiple bacterial morphotypes present, none predominant. Suggest appropriate recollection if clinically indicated.     Performed at [facility]   Report Status 02/18/2013 FINAL   Final    Coagulation Studies: No results found for this basename:  LABPROT, INR,  in the last 72 hours  Urinalysis:  Recent Labs Lab 07/17/13 1542  COLORURINE AMBER*  LABSPEC 1.030  PHURINE 6.0  GLUCOSEU NEGATIVE  HGBUR NEGATIVE  BILIRUBINUR SMALL*  KETONESUR NEGATIVE  PROTEINUR NEGATIVE  UROBILINOGEN 1.0  NITRITE NEGATIVE  LEUKOCYTESUR TRACE*    Lipid Panel:    Component Value Date/Time   CHOL 175 02/17/2013 0440   TRIG 107 02/17/2013 0440   HDL 51 02/17/2013 0440   CHOLHDL 3.4 02/17/2013 0440   VLDL 21 02/17/2013 0440   LDLCALC 103* 02/17/2013 0440    HgbA1C:  Lab Results  Component Value Date   HGBA1C 5.5 02/17/2013    Urine Drug Screen:     Component Value Date/Time   LABOPIA NONE DETECTED 02/16/2013 2207   COCAINSCRNUR NONE DETECTED 02/16/2013 2207   LABBENZ NONE DETECTED 02/16/2013 2207   AMPHETMU NONE  DETECTED 02/16/2013 2207   THCU NONE DETECTED 02/16/2013 2207   LABBARB NONE DETECTED 02/16/2013 2207    Alcohol Level: No results found for this basename: ETH,  in the last 168 hours  Other results: EKG: sinus tachycardia at 121 bpm.  Imaging: Dg Chest 2 View  07/17/2013   CLINICAL DATA:  Weakness.  Fell today.  EXAM: CHEST  2 VIEW  COMPARISON:  02/16/2013  FINDINGS: The heart size and mediastinal contours are within normal limits. Both lungs are clear. The visualized skeletal structures are unremarkable.  IMPRESSION: No active cardiopulmonary disease.   Electronically Signed   By: Amie Portland M.D.   On: 07/17/2013 17:23   Ct Head Wo Contrast  07/17/2013   CLINICAL DATA:  Generalized weakness.  EXAM: CT HEAD WITHOUT CONTRAST  TECHNIQUE: Contiguous axial images were obtained from the base of the skull through the vertex without contrast.  COMPARISON:  MR HEAD W/O CM dated 02/17/2013; MR MRA HEAD W/O CM dated 02/17/2013; CT HEAD W/O CM dated 02/16/2013  FINDINGS: Resolved right basal ganglia infarction with gliosis and encephalomalacia. Generalized atrophy. Chronic microvascular ischemic change. Scattered chronic lacunar  infarctions elsewhere throughout the white matter. Remote bilateral right greater than left cerebellar infarcts.  No definite acute cortical infarction, acute hemorrhage, mass lesion, hydrocephalus, or extra-axial fluid. Calvarium intact. Moderately advanced maxillary and ethmoid sinus disease, worse on the right. No mastoid fluid. Vascular calcification.  IMPRESSION: Chronic changes as described. No definite acute ischemia or hemorrhage.   Electronically Signed   By: Davonna Belling M.D.   On: 07/17/2013 15:08   [name] Head Wo Contrast  07/17/2013   CLINICAL DATA:  63 year old male generalized weakness, increasing since 07/09/2013. Initial encounter.  EXAM: MRI HEAD WITHOUT CONTRAST  MRA HEAD WITHOUT CONTRAST  TECHNIQUE: Multiplanar, multiecho pulse sequences of the brain and surrounding structures were obtained without intravenous contrast. Angiographic images of the head were obtained using MRA technique without contrast.  COMPARISON:  Head CT without contrast 07/17/2013. Brain MRI and MRA 02/17/2013.  FINDINGS: MRI HEAD FINDINGS  New confluent 3 cm area of restricted diffusion in the right corona radiata (series 4, image 21). Superimposed recurrent right caudate nucleus restricted diffusion, now along the inferior aspect of the caudate head (inferior to the lesion in October).  No contralateral left hemisphere or posterior fossa restricted diffusion. Major intracranial vascular flow voids are stable.  Underlying multifocal chronic (mostly lacunar) infarcts in the bilateral deep gray matter nuclei, the brainstem,, and bilateral cerebellum. Patchy and confluent underlying cerebral white matter T2 and FLAIR hyperintensity not significantly changed outside of the acute findings. Stable cerebral volume. No ventriculomegaly. Occasional chronic micro hemorrhages in the brain are stable. No acute intracranial hemorrhage identified. Negative pituitary and cervicomedullary junction. Stable visualized cervical spine.  Stable bone marrow signal.  Chronic paranasal sinus inflammatory changes and opacification have progressed on the right side. Stable left maxillary sinus mucous retention cyst. Mastoids remain clear. Visualized orbit soft tissues are within normal limits. Visible internal auditory structures appear normal. Visualized scalp soft tissues are within normal limits.  MRA HEAD FINDINGS  Stable antegrade flow in the posterior circulation. Patent vertebrobasilar junction with no distal vertebral artery stenosis. No basilar artery stenosis. Patent AICA origins. SCA and PCA origins are normal. Posterior communicating arteries are normal. Bilateral PCA branches are stable, with mild irregularity of the right P1 segment. Preserved distal PCA flow.  Stable antegrade flow in both ICA siphons. ICA siphon irregularity in keeping with atherosclerosis but no ICA  stenosis. Ophthalmic and posterior communicating artery origins are normal. Stable carotid termini. Stable MCA and ACA origins. Tortuous but otherwise negative visualized ACA is. Anterior communicating artery diminutive or absent. Left MCA branches stable and within normal limits.  Right MCA M1 segment is stable and within normal limits. Right MCA trifurcation is stable and within normal limits. No right MCA branch occlusion is identified.  IMPRESSION: 1. Acute on chronic small vessel ischemia in the right corona radiata and right caudate nucleus. No associated mass effect or hemorrhage. 2. Stable and negative intracranial MRA except for relatively mild large vessel atherosclerosis. 3. Chronic but increased paranasal sinus disease on the right.   Electronically Signed   By: Augusto Gamble M.D.   On: 07/17/2013 20:41   Mr Brain Wo Contrast  07/17/2013   CLINICAL DATA:  63 year old male generalized weakness, increasing since 07/09/2013. Initial encounter.  EXAM: MRI HEAD WITHOUT CONTRAST  MRA HEAD WITHOUT CONTRAST  TECHNIQUE: Multiplanar, multiecho pulse sequences of the brain  and surrounding structures were obtained without intravenous contrast. Angiographic images of the head were obtained using MRA technique without contrast.  COMPARISON:  Head CT without contrast 07/17/2013. Brain MRI and MRA 02/17/2013.  FINDINGS: MRI HEAD FINDINGS  New confluent 3 cm area of restricted diffusion in the right corona radiata (series 4, image 21). Superimposed recurrent right caudate nucleus restricted diffusion, now along the inferior aspect of the caudate head (inferior to the lesion in October).  No contralateral left hemisphere or posterior fossa restricted diffusion. Major intracranial vascular flow voids are stable.  Underlying multifocal chronic (mostly lacunar) infarcts in the bilateral deep gray matter nuclei, the brainstem,, and bilateral cerebellum. Patchy and confluent underlying cerebral white matter T2 and FLAIR hyperintensity not significantly changed outside of the acute findings. Stable cerebral volume. No ventriculomegaly. Occasional chronic micro hemorrhages in the brain are stable. No acute intracranial hemorrhage identified. Negative pituitary and cervicomedullary junction. Stable visualized cervical spine. Stable bone marrow signal.  Chronic paranasal sinus inflammatory changes and opacification have progressed on the right side. Stable left maxillary sinus mucous retention cyst. Mastoids remain clear. Visualized orbit soft tissues are within normal limits. Visible internal auditory structures appear normal. Visualized scalp soft tissues are within normal limits.  MRA HEAD FINDINGS  Stable antegrade flow in the posterior circulation. Patent vertebrobasilar junction with no distal vertebral artery stenosis. No basilar artery stenosis. Patent AICA origins. SCA and PCA origins are normal. Posterior communicating arteries are normal. Bilateral PCA branches are stable, with mild irregularity of the right P1 segment. Preserved distal PCA flow.  Stable antegrade flow in both ICA siphons.  ICA siphon irregularity in keeping with atherosclerosis but no ICA stenosis. Ophthalmic and posterior communicating artery origins are normal. Stable carotid termini. Stable MCA and ACA origins. Tortuous but otherwise negative visualized ACA is. Anterior communicating artery diminutive or absent. Left MCA branches stable and within normal limits.  Right MCA M1 segment is stable and within normal limits. Right MCA trifurcation is stable and within normal limits. No right MCA branch occlusion is identified.  IMPRESSION: 1. Acute on chronic small vessel ischemia in the right corona radiata and right caudate nucleus. No associated mass effect or hemorrhage. 2. Stable and negative intracranial MRA except for relatively mild large vessel atherosclerosis. 3. Chronic but increased paranasal sinus disease on the right.   Electronically Signed   By: Augusto Gamble M.D.   On: 07/17/2013 20:41    Assessment: 63 y.o. male presenting with complaints of worsening left sided weakness.  Weakness noted on examination quite different from neurological examinations reviewed from October of 2014.  Patient outside time window for tPA.  MRI of the brain reviewed and shows an acute right corona radiata infarct and right caudate nucleus infarct.  MRA of the brain is unremarkable.  Echocardiogram and carotid dopplers from October were unremarkable.  LDL was mildly elevated.  Patient is on an ASA a day at home.    Stroke Risk Factors - hyperlipidemia  Plan: 1. HgbA1c, fasting lipid panel 2. PT consult, OT consult, Speech consult 3. Echocardiogram 4. Carotid dopplers 5. Prophylactic therapy-Antiplatelet med: Aspirin - dose 325mg  daily 6. Risk factor modification 7. Telemetry monitoring 8. Frequent neuro checks    Thana FarrLeslie Hava Massingale, MD [facility] [phone] 07/18/2013, 12:24 AM

## 2013-07-19 DIAGNOSIS — R1314 Dysphagia, pharyngoesophageal phase: Secondary | ICD-10-CM

## 2013-07-19 DIAGNOSIS — I6330 Cerebral infarction due to thrombosis of unspecified cerebral artery: Secondary | ICD-10-CM

## 2013-07-19 MED ORDER — ALUM & MAG HYDROXIDE-SIMETH 200-200-20 MG/5ML PO SUSP: 30.0000 mL | Freq: Four times a day (QID) | ORAL | Status: DC | PRN

## 2013-07-19 MED ORDER — CEFUROXIME AXETIL 500 MG PO TABS
500.0000 mg | ORAL_TABLET | Freq: Two times a day (BID) | ORAL | Status: DC
  Administered 2013-07-19 – 2013-07-22 (×6): 500 mg via ORAL
  Filled 2013-07-19 (×9): qty 1

## 2013-07-19 MED ORDER — RESOURCE THICKENUP CLEAR PO POWD
ORAL | Status: DC | PRN
  Administered 2013-07-19: 01:00:00 via ORAL
  Filled 2013-07-19: qty 125

## 2013-07-19 MED ORDER — DOCUSATE SODIUM 100 MG PO CAPS
100.0000 mg | ORAL_CAPSULE | Freq: Two times a day (BID) | ORAL | Status: DC
Start: 2013-07-19 — End: 2013-07-22
  Filled 2013-07-19: qty 1

## 2013-07-19 MED ORDER — POLYETHYLENE GLYCOL 3350 17 G PO PACK
17.0000 g | PACK | Freq: Every day | ORAL | Status: DC
  Filled 2013-07-19 (×4): qty 1

## 2013-07-19 NOTE — Progress Notes (Signed)
[facility] PROGRESS NOTE  Benjamin [name]:[id number] DOB: Oct 11, 1950 DOA: 07/17/2013 PCP: Dartha Lodge, FNP  Assessment/Plan: 1. Acute CVA. Patient presenting with left-sided weakness, did not receive TPA on admission. MRI of brain revealing an acute on chronic small vessel changes in the right corona radiata and right caudate nucleus. As it appears that this presents aspirin failure, he was changed to Plavix 75 mg by mouth daily. He was evaluated by Dr Riley Kill today, may be candidate for inpatient rehab.  2. Dysphasia. Patient failing swallowing evaluation for speech pathology is recommended modified barium study. Speech path recommending Dysphagia 1 with pudding thick liquids.  3. Possible cellulitis. Will discontinue Vanc, transition to ceftin 500 mg PO BID 4. DVT prophylaxis. Lovenox  Code Status: Full Code Family Communication:  Disposition Plan: CIR    Consultants:  Neurology  Procedures:    Antibiotics:  Vancomycin  HPI/Subjective: Patient is a 63 year old gentleman with a past medical history of CVA in October of 2014, admitted to the medicine service on 07/17/2013, presenting with left upper left lower extremity weakness as well as left-sided facial droop. He had been on aspirin 325 mg by mouth daily prior to this hospitalization. Initial CT scan of brain did not reveal acute intracranial abnormality. Neurology consulted, had a followup MRI of the brain which revealed acute on chronic small vessels came in the right corona radiata and right caudate nucleus. Patient reports no improvement to neurological deficits this morning. He did fail swallow screen for which speech pathology recommended MBS.  This morning he reports feeling "about the same" with ongoing hemiparesis and slurred speech.   Objective: Filed Vitals:   07/19/13 1330  BP: 123/62  Pulse: 89  Temp: 98.2 F (36.8 C)  Resp: 18    Intake/Output Summary (Last 24 hours) at 07/19/13 1533 Last data  filed at 07/19/13 0753  Gross per 24 hour  Intake 2822.5 ml  Output      1 ml  Net 2821.5 ml   Filed Weights   07/17/13 1700  Weight: 68.04 kg (150 lb)    Exam:   General:  No acute distress, left-sided facial droop noted, slurred speech evident.  Cardiovascular: Regular rate and rhythm normal S1 is  Respiratory: Lungs are clear to auscultation bilaterally no wheezing rhonchi or rales  Abdomen: Soft nontender no side  Musculoskeletal: No edema  Neurological: Patient having 2-5 muscle strength in left upper left lower extremity, left-sided facial droop, some slurred speech evident. No alteration to sensation bilaterally.  Data Reviewed: Basic Metabolic Panel:  Recent Labs Lab 07/17/13 1327 07/18/13 0356  NA 138 141  K 4.2 3.9  CL 94* 104  CO2 26 25  GLUCOSE 135* 95  BUN 32* 24*  CREATININE 0.95 0.73  CALCIUM 10.2 9.0   Liver Function Tests:  Recent Labs Lab 07/17/13 1327 07/18/13 0356  AST 59* 47*  ALT 27 19  ALKPHOS 159* 121*  BILITOT 0.7 0.6  PROT 8.4* 6.4  ALBUMIN 3.2* 2.6*   No results found for this basename: LIPASE, AMYLASE,  in the last 168 hours No results found for this basename: AMMONIA,  in the last 168 hours CBC:  Recent Labs Lab 07/17/13 1327 07/18/13 0356  WBC 11.9* 7.6  NEUTROABS  --  5.5  HGB 14.3 11.1*  HCT 42.2 33.8*  MCV 89.8 90.1  PLT 168 156   Cardiac Enzymes:  Recent Labs Lab 07/18/13 0356  TROPONINI <0.30   BNP (last 3 results) No results found for this basename:  PROBNP,  in the last 8760 hours CBG: No results found for this basename: GLUCAP,  in the last 168 hours  Recent Results (from the past 240 hour(s))  CULTURE, BLOOD (ROUTINE X 2)     Status: None   Collection Time    07/17/13  2:42 PM      Result Value Ref Range Status   Specimen Description BLOOD ARM LEFT   Final   Special Requests BOTTLES DRAWN AEROBIC AND ANAEROBIC 5CC   Final   Culture  Setup Time     Final   Value: 07/17/2013 19:18      Performed at [facility]   Culture     Final   Value:        BLOOD CULTURE RECEIVED NO GROWTH TO DATE CULTURE WILL BE HELD FOR 5 DAYS BEFORE ISSUING A FINAL NEGATIVE REPORT     Performed at [facility]   Report Status PENDING   Incomplete  CULTURE, BLOOD (ROUTINE X 2)     Status: None   Collection Time    07/17/13  3:00 PM      Result Value Ref Range Status   Specimen Description BLOOD HAND LEFT   Final   Special Requests BOTTLES DRAWN AEROBIC ONLY 5CC   Final   Culture  Setup Time     Final   Value: 07/17/2013 19:18     Performed at [facility]   Culture     Final   Value:        BLOOD CULTURE RECEIVED NO GROWTH TO DATE CULTURE WILL BE HELD FOR 5 DAYS BEFORE ISSUING A FINAL NEGATIVE REPORT     Performed at [facility]   Report Status PENDING   Incomplete  URINE CULTURE     Status: None   Collection Time    07/17/13  3:42 PM      Result Value Ref Range Status   Specimen Description URINE, RANDOM   Final   Special Requests NONE   Final   Culture  Setup Time     Final   Value: 07/17/2013 19:35     Performed at [facility] Count     Final   Value: NO GROWTH     Performed at [facility]   Culture     Final   Value: NO GROWTH     Performed at [facility]   Report Status 07/18/2013 FINAL   Final     Studies: Dg Chest 2 View  07/17/2013   CLINICAL DATA:  Weakness.  Fell today.  EXAM: CHEST  2 VIEW  COMPARISON:  02/16/2013  FINDINGS: The heart size and mediastinal contours are within normal limits. Both lungs are clear. The visualized skeletal structures are unremarkable.  IMPRESSION: No active cardiopulmonary disease.   Electronically Signed   By: Amie Portland M.D.   On: 07/17/2013 17:23   [name] Head Wo Contrast  07/17/2013   CLINICAL DATA:  63 year old male generalized weakness, increasing since 07/09/2013. Initial encounter.  EXAM: MRI HEAD WITHOUT CONTRAST  MRA HEAD WITHOUT CONTRAST  TECHNIQUE: Multiplanar,  multiecho pulse sequences of the brain and surrounding structures were obtained without intravenous contrast. Angiographic images of the head were obtained using MRA technique without contrast.  COMPARISON:  Head CT without contrast 07/17/2013. Brain MRI and MRA 02/17/2013.  FINDINGS: MRI HEAD FINDINGS  New confluent 3 cm area of restricted diffusion in the right corona radiata (series 4, image  21). Superimposed recurrent right caudate nucleus restricted diffusion, now along the inferior aspect of the caudate head (inferior to the lesion in October).  No contralateral left hemisphere or posterior fossa restricted diffusion. Major intracranial vascular flow voids are stable.  Underlying multifocal chronic (mostly lacunar) infarcts in the bilateral deep gray matter nuclei, the brainstem,, and bilateral cerebellum. Patchy and confluent underlying cerebral white matter T2 and FLAIR hyperintensity not significantly changed outside of the acute findings. Stable cerebral volume. No ventriculomegaly. Occasional chronic micro hemorrhages in the brain are stable. No acute intracranial hemorrhage identified. Negative pituitary and cervicomedullary junction. Stable visualized cervical spine. Stable bone marrow signal.  Chronic paranasal sinus inflammatory changes and opacification have progressed on the right side. Stable left maxillary sinus mucous retention cyst. Mastoids remain clear. Visualized orbit soft tissues are within normal limits. Visible internal auditory structures appear normal. Visualized scalp soft tissues are within normal limits.  MRA HEAD FINDINGS  Stable antegrade flow in the posterior circulation. Patent vertebrobasilar junction with no distal vertebral artery stenosis. No basilar artery stenosis. Patent AICA origins. SCA and PCA origins are normal. Posterior communicating arteries are normal. Bilateral PCA branches are stable, with mild irregularity of the right P1 segment. Preserved distal PCA flow.   Stable antegrade flow in both ICA siphons. ICA siphon irregularity in keeping with atherosclerosis but no ICA stenosis. Ophthalmic and posterior communicating artery origins are normal. Stable carotid termini. Stable MCA and ACA origins. Tortuous but otherwise negative visualized ACA is. Anterior communicating artery diminutive or absent. Left MCA branches stable and within normal limits.  Right MCA M1 segment is stable and within normal limits. Right MCA trifurcation is stable and within normal limits. No right MCA branch occlusion is identified.  IMPRESSION: 1. Acute on chronic small vessel ischemia in the right corona radiata and right caudate nucleus. No associated mass effect or hemorrhage. 2. Stable and negative intracranial MRA except for relatively mild large vessel atherosclerosis. 3. Chronic but increased paranasal sinus disease on the right.   Electronically Signed   By: Augusto Gamble M.D.   On: 07/17/2013 20:41   Mr Brain Wo Contrast  07/17/2013   CLINICAL DATA:  63 year old male generalized weakness, increasing since 07/09/2013. Initial encounter.  EXAM: MRI HEAD WITHOUT CONTRAST  MRA HEAD WITHOUT CONTRAST  TECHNIQUE: Multiplanar, multiecho pulse sequences of the brain and surrounding structures were obtained without intravenous contrast. Angiographic images of the head were obtained using MRA technique without contrast.  COMPARISON:  Head CT without contrast 07/17/2013. Brain MRI and MRA 02/17/2013.  FINDINGS: MRI HEAD FINDINGS  New confluent 3 cm area of restricted diffusion in the right corona radiata (series 4, image 21). Superimposed recurrent right caudate nucleus restricted diffusion, now along the inferior aspect of the caudate head (inferior to the lesion in October).  No contralateral left hemisphere or posterior fossa restricted diffusion. Major intracranial vascular flow voids are stable.  Underlying multifocal chronic (mostly lacunar) infarcts in the bilateral deep gray matter nuclei, the  brainstem,, and bilateral cerebellum. Patchy and confluent underlying cerebral white matter T2 and FLAIR hyperintensity not significantly changed outside of the acute findings. Stable cerebral volume. No ventriculomegaly. Occasional chronic micro hemorrhages in the brain are stable. No acute intracranial hemorrhage identified. Negative pituitary and cervicomedullary junction. Stable visualized cervical spine. Stable bone marrow signal.  Chronic paranasal sinus inflammatory changes and opacification have progressed on the right side. Stable left maxillary sinus mucous retention cyst. Mastoids remain clear. Visualized orbit soft tissues are within normal limits. Visible  internal auditory structures appear normal. Visualized scalp soft tissues are within normal limits.  MRA HEAD FINDINGS  Stable antegrade flow in the posterior circulation. Patent vertebrobasilar junction with no distal vertebral artery stenosis. No basilar artery stenosis. Patent AICA origins. SCA and PCA origins are normal. Posterior communicating arteries are normal. Bilateral PCA branches are stable, with mild irregularity of the right P1 segment. Preserved distal PCA flow.  Stable antegrade flow in both ICA siphons. ICA siphon irregularity in keeping with atherosclerosis but no ICA stenosis. Ophthalmic and posterior communicating artery origins are normal. Stable carotid termini. Stable MCA and ACA origins. Tortuous but otherwise negative visualized ACA is. Anterior communicating artery diminutive or absent. Left MCA branches stable and within normal limits.  Right MCA M1 segment is stable and within normal limits. Right MCA trifurcation is stable and within normal limits. No right MCA branch occlusion is identified.  IMPRESSION: 1. Acute on chronic small vessel ischemia in the right corona radiata and right caudate nucleus. No associated mass effect or hemorrhage. 2. Stable and negative intracranial MRA except for relatively mild large vessel  atherosclerosis. 3. Chronic but increased paranasal sinus disease on the right.   Electronically Signed   By: Augusto Gamble M.D.   On: 07/17/2013 20:41   Dg Swallowing Func-speech Pathology  07/18/2013   Breck Coons [place], CCC-SLP     07/18/2013 11:03 AM Objective Swallowing Evaluation: Modified Barium Swallowing Study   Patient Details  Name: DAMIRE REMEDIOS MRN: [id number] Date of Birth: 1951-02-02  Today's Date: 07/18/2013 Time: 4098-1191 SLP Time Calculation (min): 20 min  Past Medical History:  Past Medical History  Diagnosis Date  . Stroke    Past Surgical History:  Past Surgical History  Procedure Laterality Date  . Other surgical history      Stent, unknown  . Incision and drainage      back wound  . Back surgery     HPI:  63 y.o. male with history of previous stroke in October 2014 was  brought to the ER but patient's sister noted that patient has  been having difficulty walking due to weakness in the left lower  extremity.  CT of the head was done which did not show anything  acute followed by MRI of the brain which shows acute on chronic  small vessel ischemia in the right corona radiata and right  caudate nucleus. No associated mass effect or hemorrhage.  Denies  any difficulty talking or swallowing or any headaches or visual  symptoms.  CXR No active cardiopulmonary disease.      Assessment / Plan / Recommendation Clinical Impression  Dysphagia Diagnosis: Mild oral phase dysphagia;Moderate oral  phase dysphagia;Severe pharyngeal phase dysphagia;Moderate  pharyngeal phase dysphagia Clinical impression: Pt. exhibits a mild-moderate oropharyngeal  dysphagia with lingual residue caused by decreased strength.   Pharyngeal deficits comprised of both sensory and motor  components leading to frank silent aspiration of nectar and honey  consistencies.  Volitional cough cleared a portion of  aspirates/penetrates.  Therapy techniques attempted such as chin  tuck, multiple swallows and hard cough with chin tuck   ineffective. Significant aspiration risk with any consistency  thinner than pudding/puree.  Recommend Dys 1 texture and PUDDING  thick liquids, 2 swallows and hard cough, crush meds.  ST will  follow for safety and trial of pharyngeal strengthening exercies  to facilitate muscle weakness.decreased ROM.     Treatment Recommendation  Therapy as outlined in treatment plan below    Diet Recommendation  Dysphagia 1 (Puree);Pudding-thick liquid;No  liquids   Liquid Administration via: Spoon Medication Administration: Crushed with puree Supervision: Patient able to self feed;Full supervision/cueing  for compensatory strategies Compensations: Slow rate;Small sips/bites;Multiple dry swallows  after each bite/sip;Hard cough after swallow (cough after 3  bites/sips) Postural Changes and/or Swallow Maneuvers: Seated upright 90  degrees;Upright 30-60 min after meal    Other  Recommendations Recommended Consults: MBS Oral Care Recommendations: Oral care BID   Follow Up Recommendations  Inpatient Rehab    Frequency and Duration min 2x/week  2 weeks   Pertinent Vitals/Pain WDL            Reason for Referral Objectively evaluate swallowing function   Oral Phase Oral Preparation/Oral Phase Oral Phase: Impaired Oral - Honey Oral - Honey Cup: Lingual/palatal residue Oral - Nectar Oral - Nectar Teaspoon: Lingual/palatal residue Oral - Nectar Cup: Lingual/palatal residue Oral - Solids Oral - Puree: Lingual/palatal residue (min)   Pharyngeal Phase Pharyngeal Phase Pharyngeal Phase: Impaired Pharyngeal - Honey Pharyngeal - Honey Teaspoon: Delayed swallow initiation;Premature  spillage to valleculae;Pharyngeal residue - valleculae;Reduced  tongue base retraction Pharyngeal - Honey Cup: Delayed swallow initiation;Premature  spillage to valleculae;Penetration/Aspiration during  swallow;Pharyngeal residue - valleculae;Pharyngeal residue -  pyriform sinuses;Reduced laryngeal elevation;Reduced tongue base  retraction;Reduced airway/laryngeal  closure Penetration/Aspiration details (honey cup): Material enters  airway, remains ABOVE vocal cords then ejected out;Material  enters airway, passes BELOW cords without attempt by patient to  eject out (silent aspiration) Pharyngeal - Nectar Pharyngeal - Nectar Teaspoon: Delayed swallow  initiation;Premature spillage to pyriform sinuses;Pharyngeal  residue - valleculae;Pharyngeal residue - pyriform  sinuses;Reduced tongue base retraction;Reduced laryngeal  elevation;Reduced airway/laryngeal closure;Reduced anterior  laryngeal mobility Pharyngeal - Nectar Cup: Delayed swallow initiation;Premature  spillage to pyriform sinuses;Reduced anterior laryngeal  mobility;Reduced laryngeal elevation;Reduced airway/laryngeal  closure;Reduced tongue base retraction;Penetration/Aspiration  during swallow;Pharyngeal residue - pyriform sinuses;Pharyngeal  residue - valleculae;Significant aspiration (Amount) Pharyngeal - Solids Pharyngeal - Puree: Delayed swallow initiation;Premature spillage  to pyriform sinuses;Pharyngeal residue - valleculae;Reduced  tongue base retraction  Cervical Esophageal Phase    GO    Cervical Esophageal Phase [facility]         Darrow BussingLisa Willis Litaker M.Ed CCC-SLP Pager [phone]  07/18/2013    Scheduled Meds: . clopidogrel  75 mg Oral Q breakfast  . enoxaparin (LOVENOX) injection  40 mg Subcutaneous Daily  . vancomycin  1,000 mg Intravenous Q12H   Continuous Infusions:    Principal Problem:   CVA (cerebral infarction) Active Problems:   B12 deficiency   Cellulitis    Time spent: 35 min    [name], Samanvitha Germany  [facility] Pager [phone]. If 7PM-7AM, please contact night-coverage at [email or web address], password [email or web address] 07/19/2013, 3:33 PM  LOS: 2 days

## 2013-07-19 NOTE — Progress Notes (Signed)
Dr. Vanessa BarbaraZamora notified of wounds to patient's scrotum and penis. Orders placed and carried out.

## 2013-07-19 NOTE — Consult Note (Signed)
**Note Benjamin-Identified via Obfuscation** Physical Medicine and Rehabilitation Consult Reason for Consult: CVA Referring Physician: [facility]   HPI: Benjamin [name] is a 63 y.o. right-handed male with history of previous stroke October 2014 affecting the left side and maintained on aspirin therapy and uses a walker for mobility. Patient had been living alone up until recently when his brother began staying with him to provide assistance. Admitted 07/17/2013 with increasing left-sided weakness. MRI of the brain shows acute on chronic small vessel ischemia in the right corona radiata and right caudate nucleus. MRA of the head without stenosis. Echocardiogram with ejection fraction of 60% no wall motion abnormalities. Carotid Dopplers with no ICA stenosis. Neurology services consulted placed on Plavix for CVA prophylaxis and aspirin was discontinued. Subcutaneous Lovenox for DVT prophylaxis. Currently maintained on a dysphagia 1 pudding thick liquid diet. Physical therapy evaluation completed. M.D. has requested physical medicine rehabilitation consult.  Review of Systems  Musculoskeletal: Positive for back pain.  Neurological: Positive for speech change and weakness.  All other systems reviewed and are negative.   Past Medical History  Diagnosis Date  . Stroke    Past Surgical History  Procedure Laterality Date  . Other surgical history      Stent, unknown  . Incision and drainage      back wound  . Back surgery     Family History  Problem Relation Age of Onset  . Diabetes Mellitus II Mother   . Stroke Brother    Social History:  reports that he has quit smoking. His smoking use included Cigarettes. He smoked 0.00 packs per day for .5 years. He has never used smokeless tobacco. He reports that he does not drink alcohol or use illicit drugs. Allergies: No Known Allergies Medications Prior to Admission  Medication Sig Dispense Refill  . aspirin 325 MG tablet Take 1 tablet (325 mg total) by mouth daily.  30 tablet  0  .  furosemide (LASIX) 20 MG tablet Take 20 mg by mouth.        Home: Home Living Family/patient expects to be discharged to:: Inpatient rehab Living Arrangements: Alone Home Equipment: Walker - 2 wheels Additional Comments: brother has been staying with pt, but unclear how long he will be able to continue this per pt's sister.  Pt is not cooperative with his family.  Refuses to bathe and change his clothes on a regular basis.  Pt states he needs 24/7 care.  Lives With: Alone  Functional History: Prior Function Level of Independence: Needs assistance Gait / Transfers Assistance Needed: was walking with a walker in the house Comments: had brother staying with him the past few days Functional Status:  Mobility: Bed Mobility Overal bed mobility: Needs Assistance Bed Mobility: Supine to Sit;Sit to Supine Rolling: Min assist Sidelying to sit: Mod assist Supine to sit: Min assist Sit to supine: Supervision General bed mobility comments: patient able to roll back in forther in bed for hygiene with minimal assist, but required increased assist to come to EOB, did not fully reach EOB as patient declined further mobility at this time Transfers General transfer comment: not assessed      ADL: ADL Overall ADL's : Needs assistance/impaired Eating/Feeding: Minimal assistance;Bed level Grooming: Wash/dry hands;Wash/dry face;Oral care;Supervision/safety Upper Body Bathing: Minimal assitance;Sitting Lower Body Bathing: Maximal assistance;Sitting/lateral leans;Bed level Upper Body Dressing : Minimal assistance;Sitting Lower Body Dressing: Total assistance;Sitting/lateral leans Functional mobility during ADLs:  (pt declined OOB) General ADL Comments: Required encouragement to sit EOB for assessment.  Cognition: Cognition Overall Cognitive Status: History of cognitive impairments - at baseline Arousal/Alertness: Awake/alert Orientation Level: Oriented X4 Attention: Sustained Sustained  Attention: Appears intact Memory: Appears intact Awareness: Appears intact Problem Solving:  (functional, will assess further) Safety/Judgment: Appears intact Cognition Arousal/Alertness: Awake/alert Behavior During Therapy: WFL for tasks assessed/performed Overall Cognitive Status: History of cognitive impairments - at baseline Area of Impairment: Awareness;Safety/judgement Safety/Judgement: Decreased awareness of safety;Decreased awareness of deficits Awareness: Emergent Difficult to assess due to: Impaired communication  Blood pressure 128/64, pulse 87, temperature 98.4 F (36.9 C), temperature source Oral, resp. rate 18, height 5\' 10"  (1.778 m), weight 68.04 kg (150 lb), SpO2 97.00%. Physical Exam  Vitals reviewed. Constitutional:  Frail elderly black male.  Eyes:  Pupils reactive to light  Neck: Normal range of motion. Neck supple. No thyromegaly present.  Cardiovascular: Normal rate and regular rhythm.   Respiratory:  Decreased breath sounds at the bases  GI: Soft. Bowel sounds are normal. He exhibits no distension.  Neurological: He exhibits normal muscle tone.  Left facial droop with some right facial weakness as well. Patient is severely dysarthric. He does answer simple questions. Follows two-step commands.. left pronator drift. Decreased fine motor coordination, upper greater than lower. Strength 4+/5 RUE and 4/5 LUE. LE's 4/5 but somewhat inconsistent. Basic insight and awareness  Skin: Skin is warm and dry.    No results found for this or any previous visit (from the past 24 hour(s)). Dg Chest 2 View  07/17/2013   CLINICAL DATA:  Weakness.  Fell today.  EXAM: CHEST  2 VIEW  COMPARISON:  02/16/2013  FINDINGS: The heart size and mediastinal contours are within normal limits. Both lungs are clear. The visualized skeletal structures are unremarkable.  IMPRESSION: No active cardiopulmonary disease.   Electronically Signed   By: Amie Portland M.D.   On: 07/17/2013 17:23    Ct Head Wo Contrast  07/17/2013   CLINICAL DATA:  Generalized weakness.  EXAM: CT HEAD WITHOUT CONTRAST  TECHNIQUE: Contiguous axial images were obtained from the base of the skull through the vertex without contrast.  COMPARISON:  MR HEAD W/O CM dated 02/17/2013; MR MRA HEAD W/O CM dated 02/17/2013; CT HEAD W/O CM dated 02/16/2013  FINDINGS: Resolved right basal ganglia infarction with gliosis and encephalomalacia. Generalized atrophy. Chronic microvascular ischemic change. Scattered chronic lacunar infarctions elsewhere throughout the white matter. Remote bilateral right greater than left cerebellar infarcts.  No definite acute cortical infarction, acute hemorrhage, mass lesion, hydrocephalus, or extra-axial fluid. Calvarium intact. Moderately advanced maxillary and ethmoid sinus disease, worse on the right. No mastoid fluid. Vascular calcification.  IMPRESSION: Chronic changes as described. No definite acute ischemia or hemorrhage.   Electronically Signed   By: Davonna Belling M.D.   On: 07/17/2013 15:08   [name] Head Wo Contrast  07/17/2013   CLINICAL DATA:  63 year old male generalized weakness, increasing since 07/09/2013. Initial encounter.  EXAM: MRI HEAD WITHOUT CONTRAST  MRA HEAD WITHOUT CONTRAST  TECHNIQUE: Multiplanar, multiecho pulse sequences of the brain and surrounding structures were obtained without intravenous contrast. Angiographic images of the head were obtained using MRA technique without contrast.  COMPARISON:  Head CT without contrast 07/17/2013. Brain MRI and MRA 02/17/2013.  FINDINGS: MRI HEAD FINDINGS  New confluent 3 cm area of restricted diffusion in the right corona radiata (series 4, image 21). Superimposed recurrent right caudate nucleus restricted diffusion, now along the inferior aspect of the caudate head (inferior to the lesion in October).  No contralateral left hemisphere or  posterior fossa restricted diffusion. Major intracranial vascular flow voids are stable.   Underlying multifocal chronic (mostly lacunar) infarcts in the bilateral deep gray matter nuclei, the brainstem,, and bilateral cerebellum. Patchy and confluent underlying cerebral white matter T2 and FLAIR hyperintensity not significantly changed outside of the acute findings. Stable cerebral volume. No ventriculomegaly. Occasional chronic micro hemorrhages in the brain are stable. No acute intracranial hemorrhage identified. Negative pituitary and cervicomedullary junction. Stable visualized cervical spine. Stable bone marrow signal.  Chronic paranasal sinus inflammatory changes and opacification have progressed on the right side. Stable left maxillary sinus mucous retention cyst. Mastoids remain clear. Visualized orbit soft tissues are within normal limits. Visible internal auditory structures appear normal. Visualized scalp soft tissues are within normal limits.  MRA HEAD FINDINGS  Stable antegrade flow in the posterior circulation. Patent vertebrobasilar junction with no distal vertebral artery stenosis. No basilar artery stenosis. Patent AICA origins. SCA and PCA origins are normal. Posterior communicating arteries are normal. Bilateral PCA branches are stable, with mild irregularity of the right P1 segment. Preserved distal PCA flow.  Stable antegrade flow in both ICA siphons. ICA siphon irregularity in keeping with atherosclerosis but no ICA stenosis. Ophthalmic and posterior communicating artery origins are normal. Stable carotid termini. Stable MCA and ACA origins. Tortuous but otherwise negative visualized ACA is. Anterior communicating artery diminutive or absent. Left MCA branches stable and within normal limits.  Right MCA M1 segment is stable and within normal limits. Right MCA trifurcation is stable and within normal limits. No right MCA branch occlusion is identified.  IMPRESSION: 1. Acute on chronic small vessel ischemia in the right corona radiata and right caudate nucleus. No associated mass  effect or hemorrhage. 2. Stable and negative intracranial MRA except for relatively mild large vessel atherosclerosis. 3. Chronic but increased paranasal sinus disease on the right.   Electronically Signed   By: Augusto Gamble M.D.   On: 07/17/2013 20:41   Mr Brain Wo Contrast  07/17/2013   CLINICAL DATA:  63 year old male generalized weakness, increasing since 07/09/2013. Initial encounter.  EXAM: MRI HEAD WITHOUT CONTRAST  MRA HEAD WITHOUT CONTRAST  TECHNIQUE: Multiplanar, multiecho pulse sequences of the brain and surrounding structures were obtained without intravenous contrast. Angiographic images of the head were obtained using MRA technique without contrast.  COMPARISON:  Head CT without contrast 07/17/2013. Brain MRI and MRA 02/17/2013.  FINDINGS: MRI HEAD FINDINGS  New confluent 3 cm area of restricted diffusion in the right corona radiata (series 4, image 21). Superimposed recurrent right caudate nucleus restricted diffusion, now along the inferior aspect of the caudate head (inferior to the lesion in October).  No contralateral left hemisphere or posterior fossa restricted diffusion. Major intracranial vascular flow voids are stable.  Underlying multifocal chronic (mostly lacunar) infarcts in the bilateral deep gray matter nuclei, the brainstem,, and bilateral cerebellum. Patchy and confluent underlying cerebral white matter T2 and FLAIR hyperintensity not significantly changed outside of the acute findings. Stable cerebral volume. No ventriculomegaly. Occasional chronic micro hemorrhages in the brain are stable. No acute intracranial hemorrhage identified. Negative pituitary and cervicomedullary junction. Stable visualized cervical spine. Stable bone marrow signal.  Chronic paranasal sinus inflammatory changes and opacification have progressed on the right side. Stable left maxillary sinus mucous retention cyst. Mastoids remain clear. Visualized orbit soft tissues are within normal limits. Visible internal  auditory structures appear normal. Visualized scalp soft tissues are within normal limits.  MRA HEAD FINDINGS  Stable antegrade flow in the posterior circulation. Patent vertebrobasilar junction  with no distal vertebral artery stenosis. No basilar artery stenosis. Patent AICA origins. SCA and PCA origins are normal. Posterior communicating arteries are normal. Bilateral PCA branches are stable, with mild irregularity of the right P1 segment. Preserved distal PCA flow.  Stable antegrade flow in both ICA siphons. ICA siphon irregularity in keeping with atherosclerosis but no ICA stenosis. Ophthalmic and posterior communicating artery origins are normal. Stable carotid termini. Stable MCA and ACA origins. Tortuous but otherwise negative visualized ACA is. Anterior communicating artery diminutive or absent. Left MCA branches stable and within normal limits.  Right MCA M1 segment is stable and within normal limits. Right MCA trifurcation is stable and within normal limits. No right MCA branch occlusion is identified.  IMPRESSION: 1. Acute on chronic small vessel ischemia in the right corona radiata and right caudate nucleus. No associated mass effect or hemorrhage. 2. Stable and negative intracranial MRA except for relatively mild large vessel atherosclerosis. 3. Chronic but increased paranasal sinus disease on the right.   Electronically Signed   By: Augusto Gamble M.D.   On: 07/17/2013 20:41   Dg Swallowing Func-speech Pathology  07/18/2013   Breck Coons [place], CCC-SLP     07/18/2013 11:03 AM Objective Swallowing Evaluation: Modified Barium Swallowing Study   Patient Details  Name: Benjamin [name] MRN: [id number] Date of Birth: 27-Jan-1951  Today's Date: 07/18/2013 Time: 4098-1191 SLP Time Calculation (min): 20 min  Past Medical History:  Past Medical History  Diagnosis Date  . Stroke    Past Surgical History:  Past Surgical History  Procedure Laterality Date  . Other surgical history      Stent, unknown  . Incision and  drainage      back wound  . Back surgery     HPI:  63 y.o. male with history of previous stroke in October 2014 was  brought to the ER but patient's sister noted that patient has  been having difficulty walking due to weakness in the left lower  extremity.  CT of the head was done which did not show anything  acute followed by MRI of the brain which shows acute on chronic  small vessel ischemia in the right corona radiata and right  caudate nucleus. No associated mass effect or hemorrhage.  Denies  any difficulty talking or swallowing or any headaches or visual  symptoms.  CXR No active cardiopulmonary disease.      Assessment / Plan / Recommendation Clinical Impression  Dysphagia Diagnosis: Mild oral phase dysphagia;Moderate oral  phase dysphagia;Severe pharyngeal phase dysphagia;Moderate  pharyngeal phase dysphagia Clinical impression: Pt. exhibits a mild-moderate oropharyngeal  dysphagia with lingual residue caused by decreased strength.   Pharyngeal deficits comprised of both sensory and motor  components leading to frank silent aspiration of nectar and honey  consistencies.  Volitional cough cleared a portion of  aspirates/penetrates.  Therapy techniques attempted such as chin  tuck, multiple swallows and hard cough with chin tuck  ineffective. Significant aspiration risk with any consistency  thinner than pudding/puree.  Recommend Dys 1 texture and PUDDING  thick liquids, 2 swallows and hard cough, crush meds.  ST will  follow for safety and trial of pharyngeal strengthening exercies  to facilitate muscle weakness.decreased ROM.     Treatment Recommendation  Therapy as outlined in treatment plan below    Diet Recommendation Dysphagia 1 (Puree);Pudding-thick liquid;No  liquids   Liquid Administration via: Spoon Medication Administration: Crushed with puree Supervision: Patient able to self feed;Full supervision/cueing  for compensatory strategies Compensations:  Slow rate;Small sips/bites;Multiple dry swallows   after each bite/sip;Hard cough after swallow (cough after 3  bites/sips) Postural Changes and/or Swallow Maneuvers: Seated upright 90  degrees;Upright 30-60 min after meal    Other  Recommendations Recommended Consults: MBS Oral Care Recommendations: Oral care BID   Follow Up Recommendations  Inpatient Rehab    Frequency and Duration min 2x/week  2 weeks   Pertinent Vitals/Pain WDL            Reason for Referral Objectively evaluate swallowing function   Oral Phase Oral Preparation/Oral Phase Oral Phase: Impaired Oral - Honey Oral - Honey Cup: Lingual/palatal residue Oral - Nectar Oral - Nectar Teaspoon: Lingual/palatal residue Oral - Nectar Cup: Lingual/palatal residue Oral - Solids Oral - Puree: Lingual/palatal residue (min)   Pharyngeal Phase Pharyngeal Phase Pharyngeal Phase: Impaired Pharyngeal - Honey Pharyngeal - Honey Teaspoon: Delayed swallow initiation;Premature  spillage to valleculae;Pharyngeal residue - valleculae;Reduced  tongue base retraction Pharyngeal - Honey Cup: Delayed swallow initiation;Premature  spillage to valleculae;Penetration/Aspiration during  swallow;Pharyngeal residue - valleculae;Pharyngeal residue -  pyriform sinuses;Reduced laryngeal elevation;Reduced tongue base  retraction;Reduced airway/laryngeal closure Penetration/Aspiration details (honey cup): Material enters  airway, remains ABOVE vocal cords then ejected out;Material  enters airway, passes BELOW cords without attempt by patient to  eject out (silent aspiration) Pharyngeal - Nectar Pharyngeal - Nectar Teaspoon: Delayed swallow  initiation;Premature spillage to pyriform sinuses;Pharyngeal  residue - valleculae;Pharyngeal residue - pyriform  sinuses;Reduced tongue base retraction;Reduced laryngeal  elevation;Reduced airway/laryngeal closure;Reduced anterior  laryngeal mobility Pharyngeal - Nectar Cup: Delayed swallow initiation;Premature  spillage to pyriform sinuses;Reduced anterior laryngeal  mobility;Reduced laryngeal  elevation;Reduced airway/laryngeal  closure;Reduced tongue base retraction;Penetration/Aspiration  during swallow;Pharyngeal residue - pyriform sinuses;Pharyngeal  residue - valleculae;Significant aspiration (Amount) Pharyngeal - Solids Pharyngeal - Puree: Delayed swallow initiation;Premature spillage  to pyriform sinuses;Pharyngeal residue - valleculae;Reduced  tongue base retraction  Cervical Esophageal Phase    GO    Cervical Esophageal Phase Cervical Esophageal Phase: [facility]         Darrow Bussing.Ed CCC-SLP Pager [phone]  07/18/2013    Assessment/Plan: Diagnosis: New right CR and BG infarcts 1. Does the need for close, 24 hr/day medical supervision in concert with the patient's rehab needs make it unreasonable for this patient to be served in a less intensive setting? Yes and Potentially 2. Co-Morbidities requiring supervision/potential complications: FTT 3. Due to bladder management, bowel management, safety, skin/wound care, disease management, medication administration, pain management and patient education, does the patient require 24 hr/day rehab nursing? Yes 4. Does the patient require coordinated care of a physician, rehab nurse, PT (1-2 hrs/day, 5 days/week), OT (1-2 hrs/day, 5 days/week) and SLP (1-2 hrs/day, 5 days/week) to address physical and functional deficits in the context of the above medical diagnosis(es)? Yes Addressing deficits in the following areas: balance, endurance, locomotion, strength, transferring, bowel/bladder control, bathing, dressing, feeding, grooming, toileting, cognition, speech, language, swallowing and psychosocial support 5. Can the patient actively participate in an intensive therapy program of at least 3 hrs of therapy per day at least 5 days per week? Yes 6. The potential for patient to make measurable gains while on inpatient rehab is good 7. Anticipated functional outcomes upon discharge from inpatient rehab are modified independent and supervision   with PT, modified independent and supervision with OT, supervision with SLP. 8. Estimated rehab length of stay to reach the above functional goals is: 10-14 days 9. Does the patient have adequate social supports to accommodate these discharge functional goals? Potentially 10.  Anticipated D/C setting: Home 11. Anticipated post D/C treatments: HH therapy 12. Overall Rehab/Functional Prognosis: good  RECOMMENDATIONS: This patient's condition is appropriate for continued rehabilitative care in the following setting: CIR Patient has agreed to participate in recommended program. Yes Note that insurance prior authorization may be required for reimbursement for recommended care.  Comment: Rehab Admissions Coordinator to follow up.  Thanks,  Ranelle Oyster, MD, [place] [facility]     07/19/2013

## 2013-07-19 NOTE — Evaluation (Signed)
**Note Benjamin-Identified via Obfuscation** Occupational Therapy Evaluation Patient Details Name: Benjamin [name] MRN: [id number] DOB: 24-Mar-1951 Today's Date: 07/19/2013    History of Present Illness Benjamin [name] is a 63 y.o. male who reported that he had a stroke in October of last year that affected his left side. He was ambulating with a walker. He noted that when he awakened the morning of 07/18/2013 he was unable to get to the bathroom with his walker like he usually does and felt that he was weaker on the left. He presented at that time for evaluation.    Clinical Impression   While pt was living alone up until very recently when his brother began staying with him, pt was likely marginally functioning.  His sister reports he had poor hygiene, would not accept assistance for bathing and dressing from his family. Evaluation was limited to EOB as pt stated he did not want to get up, although he could not state why.  Pt received rehab at a SNF until December. Sister states she does not think brother can provide 24 hour care on a long term basis. Pt demonstrates poor awareness of deficits and safety.      Follow Up Recommendations  SNF;Supervision/Assistance - 24 hour    Equipment Recommendations       Recommendations for Other Services       Precautions / Restrictions Precautions Precautions: Fall      Mobility Bed Mobility Overal bed mobility: Needs Assistance Bed Mobility: Supine to Sit;Sit to Supine     Supine to sit: Min assist Sit to supine: Supervision      Transfers                 General transfer comment: not assessed    Balance Overall balance assessment: Needs assistance Sitting-balance support: Feet supported Sitting balance-Leahy Scale: Fair Sitting balance - Comments: can maintain static balance, poor dynamic                            ADL Eating/Feeding: Minimal assistance;Bed level Grooming: Wash/dry hands;Wash/dry face;Oral care;Supervision/safety   Upper Body Dressing  : Minimal assistance;Sitting Lower Body Bathing: Maximal assistance;Sitting/lateral leans;Bed level Lower Body Dressing: Total assistance;Sitting/lateral leans       Functional mobility during ADLs:  (pt declined OOB) General ADL Comments: Required encouragement to sit EOB for assessment.     Vision                     Perception     Praxis      Pertinent Vitals/Pain VSS, no pain reported     Hand Dominance Right   Extremity/Trunk Assessment Upper Extremity Assessment Upper Extremity Assessment: LUE deficits/detail LUE Deficits / Details: 3+/5 strength in shoulder, 4/5 elbow to hand, this is baseline LUE Coordination: decreased gross motor;decreased fine motor   Lower Extremity Assessment Lower Extremity Assessment: Defer to PT evaluation   Cervical / Trunk Assessment Cervical / Trunk Assessment: Normal   Communication Communication Communication: Expressive difficulties   Cognition Arousal/Alertness: Awake/alert Behavior During Therapy: WFL for tasks assessed/performed Overall Cognitive Status: History of cognitive impairments - at baseline Area of Impairment: Awareness;Safety/judgement, poor historian, decreased insight         Safety/Judgement: Decreased awareness of safety;Decreased awareness of deficits Awareness: Emergent       General Comments       Exercises      Home Living Family/patient expects to be discharged to:: Inpatient rehab Living Arrangements:  Alone                           Home Equipment: Walker - 2 wheels   Additional Comments: brother has been staying with pt, but unclear how long he will be able to continue this per pt's sister.  Pt is not cooperative with his family.  Refuses to bathe and change his clothes on a regular basis.  Pt states he needs 24/7 care.  Lives With: Alone    Prior Functioning/Environment Level of Independence: Needs assistance  Gait / Transfers Assistance Needed: was walking with a  walker in the house     Comments: had brother staying with him the past few days    OT Diagnosis:     OT Problem List: Decreased strength;Decreased activity tolerance;Impaired balance (sitting and/or standing);Decreased coordination;Decreased cognition;Decreased safety awareness;Decreased knowledge of use of DME or AE;Impaired UE functional use   OT Treatment/Interventions: Self-care/ADL training;DME and/or AE instruction;Therapeutic activities;Patient/family education;Balance training;Cognitive remediation/compensation    OT Goals(Current goals can be found in the care plan section) Acute Rehab OT Goals Patient Stated Goal: go home OT Goal Formulation: With patient Time For Goal Achievement: 08/02/13 Potential to Achieve Goals: Fair ADL Goals Pt Will Perform Grooming: with min guard assist;standing Pt Will Perform Upper Body Bathing: with supervision;sitting Pt Will Perform Upper Body Dressing: with supervision;sitting Pt Will Transfer to Toilet: with min guard assist;ambulating;bedside commode (over toilet) Pt Will Perform Toileting - Clothing Manipulation and hygiene: with min guard assist;sit to/from stand  OT Frequency: Min 2X/week   Barriers to D/C:            End of Session:    Activity Tolerance: Patient tolerated treatment well Patient left: in bed;with call bell/phone within reach;with bed alarm set;with family/visitor present   Time: 1610-9604 OT Time Calculation (min): 45 min Charges:  OT General Charges $OT Visit: 1 Procedure OT Evaluation $Initial OT Evaluation Tier I: 1 Procedure OT Treatments $Self Care/Home Management : 23-37 mins G-Codes:    Evern Bio 07/19/2013, 11:34 AM [phone]

## 2013-07-19 NOTE — Progress Notes (Signed)
**Note Benjamin-Identified via Obfuscation** Speech Language Pathology Treatment: Dysphagia  Patient Details Name: Benjamin [name] MRN: [id number] DOB: 06-Sep-1950 Today's Date: 07/19/2013 Time: [id number] SLP Time Calculation (min): 10 min  Assessment / Plan / Recommendation Clinical Impression  Pt. seen for dysphagia and safety with pudding/puree texture.  He required mod-max verbal reminders to recall and implement strategies during self fed po trials. Verbal cues provided for elicitation of second swallow which was intermittently successful.  No s/s penetration or aspiration present.  Recommend continue Dys 1 diet texture and no po's thinner than pudding consistency.  ST will continue to follow.   HPI HPI: 63 y.o. male with history of previous stroke in October 2014 was brought to the ER but patient's sister noted that patient has been having difficulty walking due to weakness in the left lower extremity.  CT of the head was done which did not show anything acute followed by MRI of the brain which shows acute on chronic small vessel ischemia in the right corona radiata and right caudate nucleus. No associated mass effect or hemorrhage.  Denies any difficulty talking or swallowing or any headaches or visual symptoms.  CXR No active cardiopulmonary disease.    Pertinent Vitals WDL  SLP Plan  Continue with current plan of care    Recommendations Diet recommendations: Dysphagia 1 (puree);Pudding-thick liquid Liquids provided via: Teaspoon Medication Administration: Crushed with puree Supervision: Patient able to self feed;Full supervision/cueing for compensatory strategies Compensations: Slow rate;Small sips/bites;Multiple dry swallows after each bite/sip;Hard cough after swallow Postural Changes and/or Swallow Maneuvers: Seated upright 90 degrees;Upright 30-60 min after meal              Oral Care Recommendations: Oral care BID Follow up Recommendations: Skilled Nursing facility Plan: Continue with current plan of care          Royce MacadamiaLisa Willis Raetta Agostinelli M.Ed [employer] Pager [phone]  07/19/2013

## 2013-07-19 NOTE — Progress Notes (Signed)
**Note Benjamin-Identified via Obfuscation** Stroke Team Progress Note  HISTORY Benjamin [name] is a 63 y.o. male who reported that he had a stroke in October of last year that affected his left side. He was ambulating with a walker. He noted that when he awakened the morning of 07/18/2013 he was unable to get to the bathroom with his walker like he usually does and felt that he was weaker on the left. He presented at that time for evaluation.  Patient was on an aspirin a day at home for stroke prophylaxis.   Date last known well: Date: 07/16/2013  Time last known well: Time: 21:00  tPA Given: No: Outside time window   SUBJECTIVE The patient's wife is present. The patient voices no complaints.  OBJECTIVE Most recent Vital Signs: Filed Vitals:   07/18/13 1803 07/18/13 2115 07/19/13 0123 07/19/13 0527  BP: 110/64 138/72 124/77 122/66  Pulse: 80 89 78 76  Temp: 98.3 F (36.8 C) 98.5 F (36.9 C) 98.2 F (36.8 C) 97.8 F (36.6 C)  TempSrc: Oral Oral Oral Oral  Resp: 16 16 16 16   Height:      Weight:      SpO2: 100% 96% 100% 98%   CBG (last 3)  No results found for this basename: GLUCAP,  in the last 72 hours  IV Fluid Intake:      MEDICATIONS  . clopidogrel  75 mg Oral Q breakfast  . enoxaparin (LOVENOX) injection  40 mg Subcutaneous Daily  . vancomycin  1,000 mg Intravenous Q12H   PRN:  RESOURCE THICKENUP CLEAR, senna-docusate  Diet:  Dysphagia 1 diet with pudding thick liquids Activity:  Up with assistance DVT Prophylaxis:  Lovenox  CLINICALLY SIGNIFICANT STUDIES Basic Metabolic Panel:   Recent Labs Lab 07/17/13 1327 07/18/13 0356  NA 138 141  K 4.2 3.9  CL 94* 104  CO2 26 25  GLUCOSE 135* 95  BUN 32* 24*  CREATININE 0.95 0.73  CALCIUM 10.2 9.0   Liver Function Tests:   Recent Labs Lab 07/17/13 1327 07/18/13 0356  AST 59* 47*  ALT 27 19  ALKPHOS 159* 121*  BILITOT 0.7 0.6  PROT 8.4* 6.4  ALBUMIN 3.2* 2.6*   CBC:   Recent Labs Lab 07/17/13 1327 07/18/13 0356  WBC 11.9* 7.6  NEUTROABS   --  5.5  HGB 14.3 11.1*  HCT 42.2 33.8*  MCV 89.8 90.1  PLT 168 156   Coagulation: No results found for this basename: LABPROT, INR,  in the last 168 hours Cardiac Enzymes:   Recent Labs Lab 07/18/13 0356  TROPONINI <0.30   Urinalysis:   Recent Labs Lab 07/17/13 1542  COLORURINE AMBER*  LABSPEC 1.030  PHURINE 6.0  GLUCOSEU NEGATIVE  HGBUR NEGATIVE  BILIRUBINUR SMALL*  KETONESUR NEGATIVE  PROTEINUR NEGATIVE  UROBILINOGEN 1.0  NITRITE NEGATIVE  LEUKOCYTESUR TRACE*   Lipid Panel    Component Value Date/Time   CHOL 141 07/18/2013 0356   TRIG 83 07/18/2013 0356   HDL 32* 07/18/2013 0356   CHOLHDL 4.4 07/18/2013 0356   VLDL 17 07/18/2013 0356   LDLCALC 92 07/18/2013 0356   HgbA1C  Lab Results  Component Value Date   HGBA1C 5.8* 07/18/2013    Urine Drug Screen:     Component Value Date/Time   LABOPIA NONE DETECTED 02/16/2013 2207   COCAINSCRNUR NONE DETECTED 02/16/2013 2207   LABBENZ NONE DETECTED 02/16/2013 2207   AMPHETMU NONE DETECTED 02/16/2013 2207   THCU NONE DETECTED 02/16/2013 2207   LABBARB NONE DETECTED 02/16/2013 2207  Alcohol Level: No results found for this basename: ETH,  in the last 168 hours  Dg Chest 2 View 07/17/2013    No active cardiopulmonary disease.    Ct Head Wo Contrast 07/17/2013    Chronic changes as described. No definite acute ischemia or hemorrhage.      Mr Benjamin GlennMra Head Wo Contrast 07/17/2013    1. Acute on chronic small vessel ischemia in the right corona radiata and right caudate nucleus. No associated mass effect or hemorrhage.  2. Stable and negative intracranial MRA except for relatively mild large vessel atherosclerosis.  3. Chronic but increased paranasal sinus disease on the right.     2D Echocardiogram  ejection fraction 55-60%. No cardiac source of emboli identified.  Carotid Doppler  minimal plaque noted. No significant stenosis.  EKG  Sinus tachycardia rate 121 beats per minute. For complete results please see formal  report.   Therapy Recommendations skilled nursing facility placement recommended  Physical Exam   Neurologic Examination:  Mental Status:  Alert, oriented, thought content appropriate. Speech fluent but dysarthric. Able to follow 3 step commands without difficulty.  Cranial Nerves:  II: Discs flat bilaterally; Visual fields grossly normal, pupils equal, round, reactive to light and accommodation  III,IV, VI: ptosis not present, extra-ocular motions intact bilaterally  V,VII: left facial droop, facial light touch sensation normal bilaterally  VIII: hearing normal bilaterally  IX,X: gag reflex present  XI: bilateral shoulder shrug  XII: tongue deviation to the left  Motor:  Right : Upper extremity 5/5 Left: Upper extremity 4+/5  Lower extremity 5/5 Lower extremity 4/5  Tone and bulk:normal tone throughout; no atrophy noted  Sensory: Pinprick and light touch intact throughout, bilaterally  Deep Tendon Reflexes: 2+ and symmetric throughout  Plantars:  Right: mute Left: upgoing  Cerebellar:  normal finger-to-nose and normal heel-to-shin test  Gait: unable to test   ASSESSMENT Benjamin [name] is a 63 y.o. male presenting with increased weakness on the left with previous right CVA.. TPA was not administered secondary to late presentation. MRI/MRA -acute on chronic small vessel ischemia in the right corona radiata and right caudate nucleus. On aspirin 325 mg orally every day prior to admission. Now on Plavix 75 mg daily for secondary stroke prevention. Patient with resultant left hemiparesis. Stroke work up complete.   Previous stroke  Mild anemia  Mildly elevated liver function tests  Hemoglobin A1c 5.5  Cholesterol 141 LDL 92  Possible pericarditis by computer reading of EKG. Await echo.   Hospital day # 2  TREATMENT/PLAN  Aspirin changed to Plavix  Skilled nursing facility placement recommended by the therapists.  Stroke team will sign off.  Followup Dr. Pearlean BrownieSethi in  2 months.    Delton Seeavid Rinehuls PA-C [facility] Pager [phone] 07/19/2013, 7:59 AM  I have personally obtained a history, examined the patient, evaluated imaging results, and formulated the assessment and plan of care. I agree with the above. Delia HeadyPramod Keigan Girten, MD  To contact Stroke Continuity provider, please refer to [email or web address]. After hours, contact General Neurology

## 2013-07-20 DIAGNOSIS — R627 Adult failure to thrive: Secondary | ICD-10-CM

## 2013-07-20 MED ORDER — ATORVASTATIN CALCIUM 20 MG PO TABS
20.0000 mg | ORAL_TABLET | Freq: Every day | ORAL | Status: DC
  Administered 2013-07-20 – 2013-07-21 (×2): 20 mg via ORAL
  Filled 2013-07-20 (×3): qty 1

## 2013-07-20 MED ORDER — TAMSULOSIN HCL 0.4 MG PO CAPS
0.4000 mg | ORAL_CAPSULE | Freq: Every day | ORAL | Status: DC
  Administered 2013-07-20 – 2013-07-21 (×2): 0.4 mg via ORAL
  Filled 2013-07-20 (×3): qty 1

## 2013-07-20 NOTE — Progress Notes (Addendum)
[facility] PROGRESS NOTE  Benjamin [name] ZOX:[id number] DOB: [birth year] DOA: 07/17/2013 PCP: Benjamin Lodge, FNP  Assessment/Plan: 1. Acute CVA. Patient presenting with left-sided weakness, did not receive TPA on admission. MRI of brain revealing an acute on chronic small vessel changes in the right corona radiata and right caudate nucleus. As it appears that this presents aspirin failure, he was changed to Plavix 75 mg by mouth daily. Patient on Statin with Lipitor. He was evaluated by Dr Riley Kill today, may be candidate for inpatient rehab. Patient's neurologic exam unchanged.  2. Dysphasia. Patient failing swallowing evaluation for speech pathology is recommended modified barium study. Speech path recommending Dysphagia 1 with pudding thick liquids.  3. Possible cellulitis. Will discontinue Vanc, transition to ceftin 500 mg PO BID 4. DVT prophylaxis. Lovenox  Code Status: Full Code Family Communication:  Disposition Plan: CIR    Consultants:  Neurology  Procedures:    Antibiotics:  Vancomycin  HPI/Subjective: Patient is a 63 year old gentleman with a past medical history of CVA in October of 2014, admitted to the medicine service on 07/17/2013, presenting with left upper left lower extremity weakness as well as left-sided facial droop. He had been on aspirin 325 mg by mouth daily prior to this hospitalization. Initial CT scan of brain did not reveal acute intracranial abnormality. Neurology consulted, had a followup MRI of the brain which revealed acute on chronic small vessels came in the right corona radiata and right caudate nucleus. Patient reports no improvement to neurological deficits this morning. He did fail swallow screen for which speech pathology recommended MBS.  This morning he reports feeling "about the same" with ongoing hemiparesis and slurred speech. Tolerating PO intake  Objective: Filed Vitals:   07/20/13 0920  BP: 130/74  Pulse: 92  Temp: 98.2 F  (36.8 C)  Resp: 18    Intake/Output Summary (Last 24 hours) at 07/20/13 1156 Last data filed at 07/20/13 0800  Gross per 24 hour  Intake      0 ml  Output    200 ml  Net   -200 ml   Filed Weights   07/17/13 1700  Weight: 68.04 kg (150 lb)    Exam:   General:  No acute distress, left-sided facial droop noted, slurred speech evident.  Cardiovascular: Regular rate and rhythm normal S1 is  Respiratory: Lungs are clear to auscultation bilaterally no wheezing rhonchi or rales  Abdomen: Soft nontender no side  Musculoskeletal: No edema  Neurological: Patient having 2-5 muscle strength in left upper left lower extremity, left-sided facial droop, some slurred speech evident. No alteration to sensation bilaterally.  Data Reviewed: Basic Metabolic Panel:  Recent Labs Lab 07/17/13 1327 07/18/13 0356  NA 138 141  K 4.2 3.9  CL 94* 104  CO2 26 25  GLUCOSE 135* 95  BUN 32* 24*  CREATININE 0.95 0.73  CALCIUM 10.2 9.0   Liver Function Tests:  Recent Labs Lab 07/17/13 1327 07/18/13 0356  AST 59* 47*  ALT 27 19  ALKPHOS 159* 121*  BILITOT 0.7 0.6  PROT 8.4* 6.4  ALBUMIN 3.2* 2.6*   No results found for this basename: LIPASE, AMYLASE,  in the last 168 hours No results found for this basename: AMMONIA,  in the last 168 hours CBC:  Recent Labs Lab 07/17/13 1327 07/18/13 0356  WBC 11.9* 7.6  NEUTROABS  --  5.5  HGB 14.3 11.1*  HCT 42.2 33.8*  MCV 89.8 90.1  PLT 168 156   Cardiac Enzymes:  Recent Labs Lab 07/18/13  0356  TROPONINI <0.30   BNP (last 3 results) No results found for this basename: PROBNP,  in the last 8760 hours CBG: No results found for this basename: GLUCAP,  in the last 168 hours  Recent Results (from the past 240 hour(s))  CULTURE, BLOOD (ROUTINE X 2)     Status: None   Collection Time    07/17/13  2:42 PM      Result Value Ref Range Status   Specimen Description BLOOD ARM LEFT   Final   Special Requests BOTTLES DRAWN AEROBIC AND  ANAEROBIC 5CC   Final   Culture  Setup Time     Final   Value: 07/17/2013 19:18     Performed at [facility]   Culture     Final   Value:        BLOOD CULTURE RECEIVED NO GROWTH TO DATE CULTURE WILL BE HELD FOR 5 DAYS BEFORE ISSUING A FINAL NEGATIVE REPORT     Performed at [facility]   Report Status PENDING   Incomplete  CULTURE, BLOOD (ROUTINE X 2)     Status: None   Collection Time    07/17/13  3:00 PM      Result Value Ref Range Status   Specimen Description BLOOD HAND LEFT   Final   Special Requests BOTTLES DRAWN AEROBIC ONLY 5CC   Final   Culture  Setup Time     Final   Value: 07/17/2013 19:18     Performed at [facility]   Culture     Final   Value:        BLOOD CULTURE RECEIVED NO GROWTH TO DATE CULTURE WILL BE HELD FOR 5 DAYS BEFORE ISSUING A FINAL NEGATIVE REPORT     Performed at [facility]   Report Status PENDING   Incomplete  URINE CULTURE     Status: None   Collection Time    07/17/13  3:42 PM      Result Value Ref Range Status   Specimen Description URINE, RANDOM   Final   Special Requests NONE   Final   Culture  Setup Time     Final   Value: 07/17/2013 19:35     Performed at [facility]   Colony Count     Final   Value: NO GROWTH     Performed at [facility]   Culture     Final   Value: NO GROWTH     Performed at [facility]   Report Status 07/18/2013 FINAL   Final     Studies: No results found.  Scheduled Meds: . cefUROXime  500 mg Oral BID WC  . clopidogrel  75 mg Oral Q breakfast  . docusate sodium  100 mg Oral BID  . enoxaparin (LOVENOX) injection  40 mg Subcutaneous Daily  . polyethylene glycol  17 g Oral Daily   Continuous Infusions:    Principal Problem:   CVA (cerebral infarction) Active Problems:   B12 deficiency   Cellulitis    Time spent: 35 min    [name], [name]  [facility] Pager [phone]. If 7PM-7AM, please contact night-coverage at [email or web address],  password [email or web address] 07/20/2013, 11:56 AM  LOS: 3 days

## 2013-07-21 MED ORDER — WHITE PETROLATUM GEL
Status: AC
  Administered 2013-07-21: 17:00:00
  Filled 2013-07-21: qty 5

## 2013-07-21 NOTE — Progress Notes (Addendum)
**Note Benjamin-Identified via Obfuscation** Clinical Social Work Department CLINICAL SOCIAL WORK PLACEMENT NOTE 07/21/2013  Patient:  Benjamin [name],Benjamin [name]  Account Number:  [id number] Admit date:  07/17/2013  Clinical Social Worker:  Jetta LoutBAILEY MORGAN, Theresia MajorsLCSWA  Date/time:  07/21/2013 07:44 PM  Clinical Social Work is seeking post-discharge placement for this patient at the following level of care:   SKILLED NURSING   (*CSW will update this form in Epic as items are completed)   07/21/2013  [facility]/family provided with [facility] of Clinical Social Works list of facilities offering this level of care within the geographic area requested by the patient (or if unable, by the patients family).  07/21/2013  Patient/family informed of their freedom to choose among providers that offer the needed level of care, that participate in Medicare, Medicaid or managed care program needed by the patient, have an available bed and are willing to accept the patient.  07/21/2013  Patient/family informed of [facility] ownership interest in [facility], as well as of the fact that they are under no obligation to receive care at this facility.  PASARR submitted to EDS on  PASARR number received from EDS on   FL2 transmitted to all facilities in geographic area requested by pt/family on  07/21/2013 FL2 transmitted to all facilities within larger geographic area on   Patient informed that his/her managed care company has contracts with or will negotiate with  certain facilities, including the following:     Patient/family informed of bed offers received:  07/22/2013 [facility]  [name]ician recommends and patient chooses bed at    Patient to be transferred to  on  07/22/2013 [facility]  The following physician request were entered in Epic:   Additional Comments: Patient has an existing PASARR on his FL2 however CSW could not look up PASARR in [place] [facility]. Weekday CSW  will call [facility] when it is open.  Darlyn ChamberEmily [place], LCSWA Clinical Social Worker [phone]

## 2013-07-21 NOTE — Progress Notes (Signed)
Clinical Social Work Department BRIEF PSYCHOSOCIAL ASSESSMENT 07/21/2013  Patient:  Benjamin [name], Benjamin [name]     Account Number:  [id number]     Admit date:  07/17/2013  Clinical Social Worker:  Rolinda Roan  Date/Time:  07/21/2013 07:39 PM  Referred by:  Physician  Date Referred:  07/19/2013 Referred for  SNF Placement   Other Referral:   Interview type:  Patient Other interview type:    PSYCHOSOCIAL DATA Living Status:  ALONE Admitted from facility:   Level of care:   Primary support name:  Pam Drown Primary support relationship to patient:  SIBLING Degree of support available:   Good support per patient.    CURRENT CONCERNS  Other Concerns:    SOCIAL WORK ASSESSMENT / PLAN Clinical Social Worker (CSW) met with patient to discuss SNF placement. CIR is currently looking at patient and patient is okay with SNF search as a back up. I explained to patient that since he is medicaid pending I will have to extend the SNF search to the surrounding counties. Patient verbalized his understanding and reported that he did not have preference of facilities. Patient reported that he has applied for medicaid already.   Assessment/plan status:  Psychosocial Support/Ongoing Assessment of Needs Other assessment/ plan:   Information/referral to community resources:    PATIENT'S/FAMILY'S RESPONSE TO PLAN OF CARE: Patient thanked CSW for visit and assisting with SNF search.

## 2013-07-21 NOTE — Progress Notes (Signed)
[facility] PROGRESS NOTE  Benjamin [name]:[id number] DOB: January 11, 1951 DOA: 07/17/2013 PCP: Dartha Lodge, FNP  Assessment/Plan: 1. Acute CVA. Patient presenting with left-sided weakness, did not receive TPA on admission. MRI of brain revealing an acute on chronic small vessel changes in the right corona radiata and right caudate nucleus. As it appears that this presents aspirin failure, he was changed to Plavix 75 mg by mouth daily. Patient on Statin with Lipitor. He was evaluated by Dr Riley Kill, may be candidate for inpatient rehab. Patient's neurologic exam unchanged.  2. Dysphasia. Patient failing swallowing evaluation for speech pathology is recommended modified barium study. Speech path recommending Dysphagia 1 with pudding thick liquids.  3. Possible cellulitis. Will discontinue Vanc, transition to ceftin 500 mg PO BID 4. DVT prophylaxis. Lovenox  Code Status: Full Code Family Communication: I spoke with patient's sister Disposition Plan: CIR    Consultants:  Neurology  Procedures:    Antibiotics:  Ceftin  HPI/Subjective: Patient is a 63 year old gentleman with a past medical history of CVA in October of 2014, admitted to the medicine service on 07/17/2013, presenting with left upper left lower extremity weakness as well as left-sided facial droop. He had been on aspirin 325 mg by mouth daily prior to this hospitalization. Initial CT scan of brain did not reveal acute intracranial abnormality. Neurology consulted, had a followup MRI of the brain which revealed acute on chronic small vessels came in the right corona radiata and right caudate nucleus. Patient reports no improvement to neurological deficits this morning. He did fail swallow screen for which speech pathology recommended MBS.  This morning he reports feeling "about the same" with ongoing hemiparesis and slurred speech. Tolerating PO intake  Objective: Filed Vitals:   07/21/13 1432  BP: 127/77  Pulse: 78   Temp: 98.3 F (36.8 C)  Resp: 18    Intake/Output Summary (Last 24 hours) at 07/21/13 1451 Last data filed at 07/21/13 1314  Gross per 24 hour  Intake      0 ml  Output    475 ml  Net   -475 ml   Filed Weights   07/17/13 1700  Weight: 68.04 kg (150 lb)    Exam:   General:  No acute distress, left-sided facial droop noted, slurred speech evident.  Cardiovascular: Regular rate and rhythm normal S1 is  Respiratory: Lungs are clear to auscultation bilaterally no wheezing rhonchi or rales  Abdomen: Soft nontender no side  Musculoskeletal: No edema  Neurological: Patient having 2-5 muscle strength in left upper left lower extremity, left-sided facial droop, some slurred speech evident. No alteration to sensation bilaterally.  Data Reviewed: Basic Metabolic Panel:  Recent Labs Lab 07/17/13 1327 07/18/13 0356  NA 138 141  K 4.2 3.9  CL 94* 104  CO2 26 25  GLUCOSE 135* 95  BUN 32* 24*  CREATININE 0.95 0.73  CALCIUM 10.2 9.0   Liver Function Tests:  Recent Labs Lab 07/17/13 1327 07/18/13 0356  AST 59* 47*  ALT 27 19  ALKPHOS 159* 121*  BILITOT 0.7 0.6  PROT 8.4* 6.4  ALBUMIN 3.2* 2.6*   No results found for this basename: LIPASE, AMYLASE,  in the last 168 hours No results found for this basename: AMMONIA,  in the last 168 hours CBC:  Recent Labs Lab 07/17/13 1327 07/18/13 0356  WBC 11.9* 7.6  NEUTROABS  --  5.5  HGB 14.3 11.1*  HCT 42.2 33.8*  MCV 89.8 90.1  PLT 168 156   Cardiac Enzymes:  Recent  Labs Lab 07/18/13 0356  TROPONINI <0.30   BNP (last 3 results) No results found for this basename: PROBNP,  in the last 8760 hours CBG: No results found for this basename: GLUCAP,  in the last 168 hours  Recent Results (from the past 240 hour(s))  CULTURE, BLOOD (ROUTINE X 2)     Status: None   Collection Time    07/17/13  2:42 PM      Result Value Ref Range Status   Specimen Description BLOOD ARM LEFT   Final   Special Requests BOTTLES  DRAWN AEROBIC AND ANAEROBIC 5CC   Final   Culture  Setup Time     Final   Value: 07/17/2013 19:18     [facility]   Culture     Final   Value:        BLOOD CULTURE RECEIVED NO GROWTH TO DATE CULTURE WILL BE HELD FOR 5 DAYS BEFORE ISSUING A FINAL NEGATIVE REPORT     Performed at [facility]   Report Status PENDING   Incomplete  CULTURE, BLOOD (ROUTINE X 2)     Status: None   Collection Time    07/17/13  3:00 PM      Result Value Ref Range Status   Specimen Description BLOOD HAND LEFT   Final   Special Requests BOTTLES DRAWN AEROBIC ONLY 5CC   Final   Culture  Setup Time     Final   Value: 07/17/2013 19:18     Performed at [facility]   Culture     Final   Value:        BLOOD CULTURE RECEIVED NO GROWTH TO DATE CULTURE WILL BE HELD FOR 5 DAYS BEFORE ISSUING A FINAL NEGATIVE REPORT     Performed at [facility]   Report Status PENDING   Incomplete  URINE CULTURE     Status: None   Collection Time    07/17/13  3:42 PM      Result Value Ref Range Status   Specimen Description URINE, RANDOM   Final   Special Requests NONE   Final   Culture  Setup Time     Final   Value: 07/17/2013 19:35     Performed at [facility]   Colony Count     Final   Value: NO GROWTH     Performed at [facility]   Culture     Final   Value: NO GROWTH     Performed at [facility]   Report Status 07/18/2013 FINAL   Final     Studies: No results found.  Scheduled Meds: . atorvastatin  20 mg Oral q1800  . cefUROXime  500 mg Oral BID WC  . clopidogrel  75 mg Oral Q breakfast  . docusate sodium  100 mg Oral BID  . enoxaparin (LOVENOX) injection  40 mg Subcutaneous Daily  . polyethylene glycol  17 g Oral Daily  . tamsulosin  0.4 mg Oral QPC supper  . white petrolatum       Continuous Infusions:    Principal Problem:   CVA (cerebral infarction) Active Problems:   B12 deficiency   Cellulitis    Time spent: 35  min    [name], Benjamin Gimpel  [facility] Pager [phone]. If 7PM-7AM, please contact night-coverage at [email or web address], password [email or web address] 07/21/2013, 2:51 PM  LOS: 4 days

## 2013-07-22 DIAGNOSIS — E785 Hyperlipidemia, unspecified: Secondary | ICD-10-CM | POA: Diagnosis present

## 2013-07-22 LAB — CBC
HEMATOCRIT: 34.1 % — AB (ref 39.0–52.0)
Hemoglobin: 11.3 g/dL — ABNORMAL LOW (ref 13.0–17.0)
MCH: 29.4 pg (ref 26.0–34.0)
MCHC: 33.1 g/dL (ref 30.0–36.0)
MCV: 88.6 fL (ref 78.0–100.0)
Platelets: 266 10*3/uL (ref 150–400)
RBC: 3.85 MIL/uL — ABNORMAL LOW (ref 4.22–5.81)
RDW: 12.9 % (ref 11.5–15.5)
WBC: 6.7 10*3/uL (ref 4.0–10.5)

## 2013-07-22 LAB — BASIC METABOLIC PANEL
BUN: 13 mg/dL (ref 6–23)
CHLORIDE: 101 meq/L (ref 96–112)
CO2: 24 mEq/L (ref 19–32)
CREATININE: 0.71 mg/dL (ref 0.50–1.35)
Calcium: 9.5 mg/dL (ref 8.4–10.5)
GFR calc Af Amer: >90 mL/min (ref >90)
GFR calc non Af Amer: >90 mL/min (ref >90)
Glucose, Bld: 106 mg/dL — ABNORMAL HIGH (ref 70–99)
Potassium: 3.4 mEq/L — ABNORMAL LOW (ref 3.7–5.3)
Sodium: 138 mEq/L (ref 137–147)

## 2013-07-22 LAB — PROTEIN ELECTROPHORESIS, SERUM
ALPHA-1-GLOBULIN: 8.6 % — AB (ref 2.9–4.9)
ALPHA-2-GLOBULIN: 13 % — AB (ref 7.1–11.8)
Albumin ELP: 46.5 % — ABNORMAL LOW (ref 55.8–66.1)
BETA 2: 5.9 % (ref 3.2–6.5)
Beta Globulin: 6.5 % (ref 4.7–7.2)
GAMMA GLOBULIN: 19.5 % — AB (ref 11.1–18.8)
M-Spike, %: NOT DETECTED g/dL
Total Protein ELP: 5.9 g/dL — ABNORMAL LOW (ref 6.0–8.3)

## 2013-07-22 MED ORDER — DSS 100 MG PO CAPS: 100.0000 mg | ORAL_CAPSULE | Freq: Two times a day (BID) | ORAL | Status: DC

## 2013-07-22 MED ORDER — CLOPIDOGREL BISULFATE 75 MG PO TABS: 75.0000 mg | ORAL_TABLET | Freq: Every day | ORAL | Status: AC

## 2013-07-22 MED ORDER — POTASSIUM CHLORIDE CRYS ER 20 MEQ PO TBCR
40.0000 meq | EXTENDED_RELEASE_TABLET | Freq: Once | ORAL | Status: AC
  Administered 2013-07-22: 40 meq via ORAL
  Filled 2013-07-22: qty 2

## 2013-07-22 MED ORDER — TAMSULOSIN HCL 0.4 MG PO CAPS: 0.4000 mg | ORAL_CAPSULE | Freq: Every day | ORAL | Status: AC

## 2013-07-22 MED ORDER — POLYETHYLENE GLYCOL 3350 17 G PO PACK: 17.0000 g | PACK | Freq: Every day | ORAL | Status: DC

## 2013-07-22 MED ORDER — RESOURCE THICKENUP CLEAR PO POWD: 1.0000 | ORAL | Status: DC | PRN

## 2013-07-22 MED ORDER — CEFUROXIME AXETIL 500 MG PO TABS: 500.0000 mg | ORAL_TABLET | Freq: Two times a day (BID) | ORAL | Status: DC

## 2013-07-22 MED ORDER — ATORVASTATIN CALCIUM 20 MG PO TABS: 20.0000 mg | ORAL_TABLET | Freq: Every day | ORAL | Status: AC

## 2013-07-22 MED ORDER — SENNOSIDES-DOCUSATE SODIUM 8.6-50 MG PO TABS: 1.0000 | ORAL_TABLET | Freq: Every evening | ORAL | Status: AC | PRN

## 2013-07-22 MED ORDER — ASPIRIN EC 81 MG PO TBEC: 81.0000 mg | DELAYED_RELEASE_TABLET | Freq: Every day | ORAL | Status: DC

## 2013-07-22 NOTE — ED Provider Notes (Signed)
Medical screening examination/treatment/procedure(s) were conducted as a shared visit with non-physician practitioner(s) and myself.  I personally evaluated the patient during the encounter.   EKG Interpretation None       Shelda JakesScott W. Shakina Choy, MD 07/22/13 [phone]

## 2013-07-22 NOTE — Progress Notes (Addendum)
Writer [facility] but [facility]. However  Has been transported by [facility] to the facility. Assessments remained unchanged prior to discharge. Admission RN followed up to get report. Report was given to [facility] (LPN[facility].

## 2013-07-22 NOTE — Discharge Summary (Signed)
Physician Discharge Summary  Benjamin [name]:[id number] DOB: [birth year] DOA: 07/17/2013  PCP: Dartha Lodge, FNP  Admit date: 07/17/2013 Discharge date: 07/22/2013  Time spent: 35 minutes  Recommendations for Outpatient Follow-up:  1. Follow up on BMP and CBC in 4-5 days  Discharge Diagnoses:  Principal Problem:   CVA (cerebral infarction) Active Problems:   Cellulitis   Dyslipidemia   Weakness generalized   Left leg weakness   Discharge Condition: Stable  Diet recommendation: Dysphagia 1 (pure): Pudding thick liquid  Filed Weights   07/17/13 1700  Weight: 68.04 kg (150 lb)    History of present illness:  Benjamin [name] is a 63 y.o. male with history of previous stroke in October 2014 was brought to the ER but patient's sister noted that patient has been having difficulty walking due to weakness in the left lower extremity. As per patient's sister the patient has been having weakness over the last one week and has been slumping towards the right side. Patient became more weaker today and since patient had a difficult to walk was brought to the ER. CT of the head was done which did not show anything acute followed by MRI of the brain which shows possible stroke. Neurologist on-call was consulted and at this time patient has been admitted for further stroke workup. In addition patient noticed to have left lower extremity psoriasis for which patient has been started vancomycin. Patient is a poor historian and most of the history obtained from patient's sister. Patient does not complain of any chest pain shortness of breath nausea vomiting abdominal pain. Denies any difficulty talking or swallowing or any headaches or visual symptoms.    Hospital Course:  Patient is a pleasant 63 year old, with a past medical history of CVA in October 2014, admitted to the medicine service on 07/17/2013 presenting with left-sided hemiparesis and left facial droop. Patient had a CT scan done in the  emergency room which did not reveal acute intracranial changes. Dr. Thad Ranger of neurology was consulted. Patient was placed on the CVA protocol and admitted to the neuro floor. Followup studies included an MRI of the brain performed on 07/17/2013 which showed an acute on chronic small vessel ischemia in the right corona radiata and right caudate nucleus without associated mass effect or hemorrhage. He had a stable and negative intracranial MRA. Transthoracic echocardiogram performed on 07/18/2013 showed an ejection fraction 55-60% without wall motion abnormalities. Patient apparently had been on aspirin therapy prior to this presentation, change to Plavix any 5 mg by mouth daily. He was also Lipitor. Other issues addressed on this hospitalization included left lower extremity cellulitis, initially treated with IV vancomycin, change to Ceftin. Patient was evaluated by speech pathology, having signs and symptoms of aspiration. Speech recommending dysphasia 1 diet with pudding thick liquid. He was discharged in stable condition to a skilled nursing facility for acute rehabilitation.  Procedures:  Transthoracic echocardiogram performed on 07/18/2013 showed an ejection fraction of 55-60% without wall motion abnormalities  Bilateral carotid Dopplers performed at 3 07/18/2013 noted 1-39% ICA stenosis  Consultations:  Neurology  Discharge Exam: Filed Vitals:   07/22/13 1417  BP: 117/77  Pulse: 85  Temp: 97.3 F (36.3 C)  Resp: 20   General: No acute distress, left-sided facial droop noted, slurred speech evident.  Cardiovascular: Regular rate and rhythm normal S1 S2 Respiratory: Lungs are clear to auscultation bilaterally no wheezing rhonchi or rales  Abdomen: Soft nontender   Musculoskeletal: No edema Neurological: Patient having 2-5 muscle strength in  left upper left lower extremity, left-sided facial droop, some slurred speech evident. No alteration to sensation bilaterally   Discharge  Instructions      Discharge Orders   Future Orders Complete By Expires   Call MD for:  difficulty breathing, headache or visual disturbances  As directed    Call MD for:  extreme fatigue  As directed    Call MD for:  hives  As directed    Call MD for:  persistant dizziness or light-headedness  As directed    Call MD for:  persistant nausea and vomiting  As directed    Call MD for:  redness, tenderness, or signs of infection (pain, swelling, redness, odor or green/yellow discharge around incision site)  As directed    Call MD for:  severe uncontrolled pain  As directed    Call MD for:  temperature >100.4  As directed    Diet - low sodium heart healthy  As directed    Increase activity slowly  As directed        Medication List    STOP taking these medications       aspirin 325 MG tablet      TAKE these medications       atorvastatin 20 MG tablet  Commonly known as:  LIPITOR  Take 1 tablet (20 mg total) by mouth daily at 6 PM.     cefUROXime 500 MG tablet  Commonly known as:  CEFTIN  Take 1 tablet (500 mg total) by mouth 2 (two) times daily with a meal.     clopidogrel 75 MG tablet  Commonly known as:  PLAVIX  Take 1 tablet (75 mg total) by mouth daily with breakfast.     DSS 100 MG Caps  Take 100 mg by mouth 2 (two) times daily.     furosemide 20 MG tablet  Commonly known as:  LASIX  Take 20 mg by mouth.     polyethylene glycol packet  Commonly known as:  MIRALAX / GLYCOLAX  Take 17 g by mouth daily.     RESOURCE THICKENUP CLEAR Powd  Take 120 g by mouth as needed.     senna-docusate 8.6-50 MG per tablet  Commonly known as:  Senokot-S  Take 1 tablet by mouth at bedtime as needed for mild constipation.     tamsulosin 0.4 MG Caps capsule  Commonly known as:  FLOMAX  Take 1 capsule (0.4 mg total) by mouth daily after supper.       No Known Allergies Follow-up Information   Follow up with Gates Rigg, MD. Schedule an appointment as soon as possible  for a visit in 2 months.   Specialties:  Neurology, Radiology   Contact information:   [address] [place] [address] [phone]       Follow up with STEELE,ANTHONY, FNP In 2 weeks.   Specialty:  Nurse Practitioner   Contact information:   [address] [place] [address] [phone]        The results of significant diagnostics from this hospitalization (including imaging, microbiology, ancillary and laboratory) are listed below for reference.    Significant Diagnostic Studies: Dg Chest 2 View  07/17/2013   CLINICAL DATA:  Weakness.  Fell today.  EXAM: CHEST  2 VIEW  COMPARISON:  02/16/2013  FINDINGS: The heart size and mediastinal contours are within normal limits. Both lungs are clear. The visualized skeletal structures are unremarkable.  IMPRESSION: No active cardiopulmonary disease.  Electronically Signed   By: Amie Portland M.D.   On: 07/17/2013 17:23   Ct Head Wo Contrast  07/17/2013   CLINICAL DATA:  Generalized weakness.  EXAM: CT HEAD WITHOUT CONTRAST  TECHNIQUE: Contiguous axial images were obtained from the base of the skull through the vertex without contrast.  COMPARISON:  MR HEAD W/O CM dated 02/17/2013; MR MRA HEAD W/O CM dated 02/17/2013; CT HEAD W/O CM dated 02/16/2013  FINDINGS: Resolved right basal ganglia infarction with gliosis and encephalomalacia. Generalized atrophy. Chronic microvascular ischemic change. Scattered chronic lacunar infarctions elsewhere throughout the white matter. Remote bilateral right greater than left cerebellar infarcts.  No definite acute cortical infarction, acute hemorrhage, mass lesion, hydrocephalus, or extra-axial fluid. Calvarium intact. Moderately advanced maxillary and ethmoid sinus disease, worse on the right. No mastoid fluid. Vascular calcification.  IMPRESSION: Chronic changes as described. No definite acute ischemia or hemorrhage.   Electronically Signed   By: Davonna Belling M.D.   On: 07/17/2013 15:08    [name] Head Wo Contrast  07/17/2013   CLINICAL DATA:  63 year old male generalized weakness, increasing since 07/09/2013. Initial encounter.  EXAM: MRI HEAD WITHOUT CONTRAST  MRA HEAD WITHOUT CONTRAST  TECHNIQUE: Multiplanar, multiecho pulse sequences of the brain and surrounding structures were obtained without intravenous contrast. Angiographic images of the head were obtained using MRA technique without contrast.  COMPARISON:  Head CT without contrast 07/17/2013. Brain MRI and MRA 02/17/2013.  FINDINGS: MRI HEAD FINDINGS  New confluent 3 cm area of restricted diffusion in the right corona radiata (series 4, image 21). Superimposed recurrent right caudate nucleus restricted diffusion, now along the inferior aspect of the caudate head (inferior to the lesion in October).  No contralateral left hemisphere or posterior fossa restricted diffusion. Major intracranial vascular flow voids are stable.  Underlying multifocal chronic (mostly lacunar) infarcts in the bilateral deep gray matter nuclei, the brainstem,, and bilateral cerebellum. Patchy and confluent underlying cerebral white matter T2 and FLAIR hyperintensity not significantly changed outside of the acute findings. Stable cerebral volume. No ventriculomegaly. Occasional chronic micro hemorrhages in the brain are stable. No acute intracranial hemorrhage identified. Negative pituitary and cervicomedullary junction. Stable visualized cervical spine. Stable bone marrow signal.  Chronic paranasal sinus inflammatory changes and opacification have progressed on the right side. Stable left maxillary sinus mucous retention cyst. Mastoids remain clear. Visualized orbit soft tissues are within normal limits. Visible internal auditory structures appear normal. Visualized scalp soft tissues are within normal limits.  MRA HEAD FINDINGS  Stable antegrade flow in the posterior circulation. Patent vertebrobasilar junction with no distal vertebral artery stenosis. No  basilar artery stenosis. Patent AICA origins. SCA and PCA origins are normal. Posterior communicating arteries are normal. Bilateral PCA branches are stable, with mild irregularity of the right P1 segment. Preserved distal PCA flow.  Stable antegrade flow in both ICA siphons. ICA siphon irregularity in keeping with atherosclerosis but no ICA stenosis. Ophthalmic and posterior communicating artery origins are normal. Stable carotid termini. Stable MCA and ACA origins. Tortuous but otherwise negative visualized ACA is. Anterior communicating artery diminutive or absent. Left MCA branches stable and within normal limits.  Right MCA M1 segment is stable and within normal limits. Right MCA trifurcation is stable and within normal limits. No right MCA branch occlusion is identified.  IMPRESSION: 1. Acute on chronic small vessel ischemia in the right corona radiata and right caudate nucleus. No associated mass effect or hemorrhage. 2. Stable and negative intracranial MRA except for relatively mild large vessel  atherosclerosis. 3. Chronic but increased paranasal sinus disease on the right.   Electronically Signed   By: Augusto Gamble M.D.   On: 07/17/2013 20:41   Mr Brain Wo Contrast  07/17/2013   CLINICAL DATA:  63 year old male generalized weakness, increasing since 07/09/2013. Initial encounter.  EXAM: MRI HEAD WITHOUT CONTRAST  MRA HEAD WITHOUT CONTRAST  TECHNIQUE: Multiplanar, multiecho pulse sequences of the brain and surrounding structures were obtained without intravenous contrast. Angiographic images of the head were obtained using MRA technique without contrast.  COMPARISON:  Head CT without contrast 07/17/2013. Brain MRI and MRA 02/17/2013.  FINDINGS: MRI HEAD FINDINGS  New confluent 3 cm area of restricted diffusion in the right corona radiata (series 4, image 21). Superimposed recurrent right caudate nucleus restricted diffusion, now along the inferior aspect of the caudate head (inferior to the lesion in  October).  No contralateral left hemisphere or posterior fossa restricted diffusion. Major intracranial vascular flow voids are stable.  Underlying multifocal chronic (mostly lacunar) infarcts in the bilateral deep gray matter nuclei, the brainstem,, and bilateral cerebellum. Patchy and confluent underlying cerebral white matter T2 and FLAIR hyperintensity not significantly changed outside of the acute findings. Stable cerebral volume. No ventriculomegaly. Occasional chronic micro hemorrhages in the brain are stable. No acute intracranial hemorrhage identified. Negative pituitary and cervicomedullary junction. Stable visualized cervical spine. Stable bone marrow signal.  Chronic paranasal sinus inflammatory changes and opacification have progressed on the right side. Stable left maxillary sinus mucous retention cyst. Mastoids remain clear. Visualized orbit soft tissues are within normal limits. Visible internal auditory structures appear normal. Visualized scalp soft tissues are within normal limits.  MRA HEAD FINDINGS  Stable antegrade flow in the posterior circulation. Patent vertebrobasilar junction with no distal vertebral artery stenosis. No basilar artery stenosis. Patent AICA origins. SCA and PCA origins are normal. Posterior communicating arteries are normal. Bilateral PCA branches are stable, with mild irregularity of the right P1 segment. Preserved distal PCA flow.  Stable antegrade flow in both ICA siphons. ICA siphon irregularity in keeping with atherosclerosis but no ICA stenosis. Ophthalmic and posterior communicating artery origins are normal. Stable carotid termini. Stable MCA and ACA origins. Tortuous but otherwise negative visualized ACA is. Anterior communicating artery diminutive or absent. Left MCA branches stable and within normal limits.  Right MCA M1 segment is stable and within normal limits. Right MCA trifurcation is stable and within normal limits. No right MCA branch occlusion is  identified.  IMPRESSION: 1. Acute on chronic small vessel ischemia in the right corona radiata and right caudate nucleus. No associated mass effect or hemorrhage. 2. Stable and negative intracranial MRA except for relatively mild large vessel atherosclerosis. 3. Chronic but increased paranasal sinus disease on the right.   Electronically Signed   By: Augusto Gamble M.D.   On: 07/17/2013 20:41   Dg Swallowing Func-speech Pathology  07/18/2013   Breck Coons Garey, CCC-SLP     07/18/2013 11:03 AM Objective Swallowing Evaluation: Modified Barium Swallowing Study   Patient Details  Name: Benjamin [name] MRN: [id number] Date of Birth: 04-30-1950  Today's Date: 07/18/2013 Time: 4098-1191 SLP Time Calculation (min): 20 min  Past Medical History:  Past Medical History  Diagnosis Date  . Stroke    Past Surgical History:  Past Surgical History  Procedure Laterality Date  . Other surgical history      Stent, unknown  . Incision and drainage      back wound  . Back surgery     HPI:  63 y.o. male with history of previous stroke in October 2014 was  brought to the ER but patient's sister noted that patient has  been having difficulty walking due to weakness in the left lower  extremity.  CT of the head was done which did not show anything  acute followed by MRI of the brain which shows acute on chronic  small vessel ischemia in the right corona radiata and right  caudate nucleus. No associated mass effect or hemorrhage.  Denies  any difficulty talking or swallowing or any headaches or visual  symptoms.  CXR No active cardiopulmonary disease.      Assessment / Plan / Recommendation Clinical Impression  Dysphagia Diagnosis: Mild oral phase dysphagia;Moderate oral  phase dysphagia;Severe pharyngeal phase dysphagia;Moderate  pharyngeal phase dysphagia Clinical impression: Pt. exhibits a mild-moderate oropharyngeal  dysphagia with lingual residue caused by decreased strength.   Pharyngeal deficits comprised of both sensory and motor   components leading to frank silent aspiration of nectar and honey  consistencies.  Volitional cough cleared a portion of  aspirates/penetrates.  Therapy techniques attempted such as chin  tuck, multiple swallows and hard cough with chin tuck  ineffective. Significant aspiration risk with any consistency  thinner than pudding/puree.  Recommend Dys 1 texture and PUDDING  thick liquids, 2 swallows and hard cough, crush meds.  ST will  follow for safety and trial of pharyngeal strengthening exercies  to facilitate muscle weakness.decreased ROM.     Treatment Recommendation  Therapy as outlined in treatment plan below    Diet Recommendation Dysphagia 1 (Puree);Pudding-thick liquid;No  liquids   Liquid Administration via: Spoon Medication Administration: Crushed with puree Supervision: Patient able to self feed;Full supervision/cueing  for compensatory strategies Compensations: Slow rate;Small sips/bites;Multiple dry swallows  after each bite/sip;Hard cough after swallow (cough after 3  bites/sips) Postural Changes and/or Swallow Maneuvers: Seated upright 90  degrees;Upright 30-60 min after meal    Other  Recommendations Recommended Consults: MBS Oral Care Recommendations: Oral care BID   Follow Up Recommendations  Inpatient Rehab    Frequency and Duration min 2x/week  2 weeks   Pertinent Vitals/Pain WDL            Reason for Referral Objectively evaluate swallowing function   Oral Phase Oral Preparation/Oral Phase Oral Phase: Impaired Oral - Honey Oral - Honey Cup: Lingual/palatal residue Oral - Nectar Oral - Nectar Teaspoon: Lingual/palatal residue Oral - Nectar Cup: Lingual/palatal residue Oral - Solids Oral - Puree: Lingual/palatal residue (min)   Pharyngeal Phase Pharyngeal Phase Pharyngeal Phase: Impaired Pharyngeal - Honey Pharyngeal - Honey Teaspoon: Delayed swallow initiation;Premature  spillage to valleculae;Pharyngeal residue - valleculae;Reduced  tongue base retraction Pharyngeal - Honey Cup: Delayed swallow  initiation;Premature  spillage to valleculae;Penetration/Aspiration during  swallow;Pharyngeal residue - valleculae;Pharyngeal residue -  pyriform sinuses;Reduced laryngeal elevation;Reduced tongue base  retraction;Reduced airway/laryngeal closure Penetration/Aspiration details (honey cup): Material enters  airway, remains ABOVE vocal cords then ejected out;Material  enters airway, passes BELOW cords without attempt by patient to  eject out (silent aspiration) Pharyngeal - Nectar Pharyngeal - Nectar Teaspoon: Delayed swallow  initiation;Premature spillage to pyriform sinuses;Pharyngeal  residue - valleculae;Pharyngeal residue - pyriform  sinuses;Reduced tongue base retraction;Reduced laryngeal  elevation;Reduced airway/laryngeal closure;Reduced anterior  laryngeal mobility Pharyngeal - Nectar Cup: Delayed swallow initiation;Premature  spillage to pyriform sinuses;Reduced anterior laryngeal  mobility;Reduced laryngeal elevation;Reduced airway/laryngeal  closure;Reduced tongue base retraction;Penetration/Aspiration  during swallow;Pharyngeal residue - pyriform sinuses;Pharyngeal  residue - valleculae;Significant aspiration (Amount) Pharyngeal - Solids Pharyngeal - Puree: Delayed swallow  initiation;Premature spillage  to pyriform sinuses;Pharyngeal residue - valleculae;Reduced  tongue base retraction  Cervical Esophageal Phase    GO    Cervical Esophageal Phase Cervical Esophageal Phase: [facility]         Darrow Bussing.Ed CCC-SLP Pager [phone]  07/18/2013    Microbiology: Recent Results (from the past 240 hour(s))  CULTURE, BLOOD (ROUTINE X 2)     Status: None   Collection Time    07/17/13  2:42 PM      Result Value Ref Range Status   Specimen Description BLOOD ARM LEFT   Final   Special Requests BOTTLES DRAWN AEROBIC AND ANAEROBIC 5CC   Final   Culture  Setup Time     Final   Value: 07/17/2013 19:18     Performed at [facility]   Culture     Final   Value:        BLOOD CULTURE RECEIVED NO  GROWTH TO DATE CULTURE WILL BE HELD FOR 5 DAYS BEFORE ISSUING A FINAL NEGATIVE REPORT     Performed at [facility]   Report Status PENDING   Incomplete  CULTURE, BLOOD (ROUTINE X 2)     Status: None   Collection Time    07/17/13  3:00 PM      Result Value Ref Range Status   Specimen Description BLOOD HAND LEFT   Final   Special Requests BOTTLES DRAWN AEROBIC ONLY 5CC   Final   Culture  Setup Time     Final   Value: 07/17/2013 19:18     Performed at [facility]   Culture     Final   Value:        BLOOD CULTURE RECEIVED NO GROWTH TO DATE CULTURE WILL BE HELD FOR 5 DAYS BEFORE ISSUING A FINAL NEGATIVE REPORT     Performed at [facility]   Report Status PENDING   Incomplete  URINE CULTURE     Status: None   Collection Time    07/17/13  3:42 PM      Result Value Ref Range Status   Specimen Description URINE, RANDOM   Final   Special Requests NONE   Final   Culture  Setup Time     Final   Value: 07/17/2013 19:35     Performed at [facility] Count     Final   Value: NO GROWTH     Performed at [facility]   Culture     Final   Value: NO GROWTH     Performed at [facility]   Report Status 07/18/2013 FINAL   Final     Labs: Basic Metabolic Panel:  Recent Labs Lab 07/17/13 1327 07/18/13 0356 07/22/13 0410  NA 138 141 138  K 4.2 3.9 3.4*  CL 94* 104 101  CO2 26 25 24   GLUCOSE 135* 95 106*  BUN 32* 24* 13  CREATININE 0.95 0.73 0.71  CALCIUM 10.2 9.0 9.5   Liver Function Tests:  Recent Labs Lab 07/17/13 1327 07/18/13 0356  AST 59* 47*  ALT 27 19  ALKPHOS 159* 121*  BILITOT 0.7 0.6  PROT 8.4* 6.4  ALBUMIN 3.2* 2.6*   No results found for this basename: LIPASE, AMYLASE,  in the last 168 hours No results found for this basename: AMMONIA,  in the last 168 hours CBC:  Recent Labs Lab 07/17/13 1327 07/18/13 0356 07/22/13 0410  WBC 11.9* 7.6 6.7  NEUTROABS  --  5.5  --   HGB 14.3 11.1* 11.3*  HCT  42.2 33.8* 34.1*  MCV 89.8 90.1 88.6  PLT 168 156 266   Cardiac Enzymes:  Recent Labs Lab 07/18/13 0356  TROPONINI <0.30   BNP: BNP (last 3 results) No results found for this basename: PROBNP,  in the last 8760 hours CBG: No results found for this basename: GLUCAP,  in the last 168 hours     Signed:  [name], Jaelon Gatley  [facility] 07/22/2013, 2:53 PM

## 2013-07-22 NOTE — Progress Notes (Signed)
Physical Therapy Treatment Patient Details Name: Benjamin [name] MRN: [id number] DOB: [birth year] Today's Date: 07/22/2013    History of Present Illness Benjamin [name] is a 63 y.o. male who reported that he had a stroke in October of last year that affected his left side. He was ambulating with a walker. He noted that when he awakened the morning of 07/18/2013 he was unable to get to the bathroom with his walker like he usually does and felt that he was weaker on the left. He presented at that time for evaluation.     PT Comments    Patient remains reluctant to participate in therapy.  Initially refusing OOB activity but with encouragement agreeable to some standing and pre gait activities.  After a few trials, patient laid himself back down in the bed and stated that he did not want to do any more therapy.  Patient continues to refuse ambulation at this time. Concerned over patient willingness to participate in therapy. Spoke with patient about importance of participation.  Will continue to try and progress as tolerated. While patient may benefit greatly from CIR, question pt's willingness to participate and ability to care for himself post discharge (poor self care PTA).   Follow Up Recommendations  SNF;Supervision/Assistance - 24 hour     Equipment Recommendations   (tbd)    Recommendations for Other Services       Precautions / Restrictions Precautions Precautions: Fall Restrictions Weight Bearing Restrictions: No    Mobility  Bed Mobility Overal bed mobility: Needs Assistance Bed Mobility: Rolling;Sidelying to Sit;Supine to Sit Rolling: Min assist   Supine to sit: Min assist     General bed mobility comments: patient able to roll back in forther in bed for hygiene with minimal assist, but required increased assist to come to EOB, did not fully reach EOB as patient declined further mobility at this time  Transfers Overall transfer level: Needs assistance Equipment used:  Rolling walker (2 wheeled) Transfers: Sit to/from Stand Sit to Stand: Mod assist         General transfer comment: performed in conjunction with pre gait standing trials performed x4  Ambulation/Gait             General Gait Details: patient refusing ambulation attempt   Stairs            Wheelchair Mobility    Modified Rankin (Stroke Patients Only) Modified Rankin (Stroke Patients Only) Pre-Morbid Rankin Score: Moderate disability Modified Rankin: Severe disability     Balance Overall balance assessment: Needs assistance Sitting-balance support: Feet supported Sitting balance-Leahy Scale: Fair Sitting balance - Comments: improved ability to maintain seated balance but as more dynamic activity performed patient begins to demonstrate posterior lean and difficulty correcting Postural control: Posterior lean Standing balance support: Bilateral upper extremity supported;During functional activity Standing balance-Leahy Scale: Poor Standing balance comment: pre gait standing trials, patient very reluctant to perform any standing or OOB activity Single Leg Stance - Right Leg:  (less than 2 seconds with max support) Single Leg Stance - Left Leg:  (less than 2 seconds with max support)                Cognition Arousal/Alertness: Awake/alert Behavior During Therapy: WFL for tasks assessed/performed Overall Cognitive Status: History of cognitive impairments - at baseline Area of Impairment: Awareness;Safety/judgement         Safety/Judgement: Decreased awareness of safety;Decreased awareness of deficits Awareness: Emergent        Exercises  General Comments  Performed multiple trials of standing with max encouragement to participate. Performed standing weight shifts with bilateral UE support and moderate assist from therapist.  Tactile cues for upright posture and good alignment. Performed weight shifts with knee flexion x5, weight shift with kick out  x5 and EOB there ex including LAQ and ankle pumps.  Increased time to perform all activities and max encouragement needed secondary to patient self limiting participation.      Pertinent Vitals/Pain Patient reports no pain at this time    Home Living                      Prior Function            PT Goals (current goals can now be found in the care plan section) Acute Rehab PT Goals Patient Stated Goal: none stated PT Goal Formulation: With patient Time For Goal Achievement: 08/01/13 Potential to Achieve Goals: Fair Progress towards PT goals: Progressing toward goals (modestly)    Frequency  Min 3X/week    PT Plan Current plan remains appropriate    End of Session Equipment Utilized During Treatment: Gait belt Activity Tolerance: Patient limited by fatigue Patient left: in bed;with call bell/phone within reach;with bed alarm set (patient refusing OOB)     Time: 8413-24400938-1002 PT Time Calculation (min): 24 min  Charges:  $Therapeutic Activity: 23-37 mins                    G CodesFabio [name]:      Benjamin [name] Benjamin [name] 07/22/2013, 10:06 AM Benjamin [name], PT DPT  [phone]

## 2013-07-22 NOTE — Progress Notes (Signed)
Rehab admissions - Evaluated for possible admission.  I met with patient and his sister.  Patient had a brother staying with him PTA for the last 2 weeks.  Brother now has left for [place].  Sister is room says patient cannot come home with her.  No children per sister.  No other caregiver support.  Feel patient will need SNF placement with such decreased caregiver support.  Call me for questions.  #[phone]

## 2013-07-22 NOTE — Clinical Social Work Note (Signed)
Pt has received one bed offer for SNF placement; [facility]. CSW spoke with pt's sister, Erskine SquibbJane, regarding discharge disposition. Erskine SquibbJane stated that she had wished for closer placement to her ([place] area) but understand that due to pt's lack of medical insurance, bed placement was difficult to come by. Erskine SquibbJane stated that she was agreeable to [facility] [place].   CSW spoke with pt at bedside regarding SNF placement. Pt stated that he was agreeable [facility] [place] and understood he would be [facility] and [facility].  CSW [facility] with [facility] who stated she had concern regarding pt signing his admission paperwork. CSW and RN had pt sign a blank sheet of paper. CSW reported to [facility] that pt was able to sign his name and is alert and oriented x4. Per admissions liaison, on pt's last admission pt was unable to sign himself in. CSW and admissions liaison have left messages for pt's sister regarding completing remaining paperwork at a later date.   CS[facility] [place] [facility]. CSW has completed pt's discharge packet and placed on pt's shadow chart. CSW has arranged for ambulance transportation for 4pm on 07/22/2013 via [facility].  RN to please call [facility] at [phone]. *Pt to be admitted to [facility] of SNF in room 310-B*  Darlyn ChamberEmily [place], [facility] Clinical Social Worker [phone]

## 2013-07-22 NOTE — Progress Notes (Signed)
Occupational Therapy Treatment Patient Details Name: Benjamin [name] MRN: [id number] DOB: 03/20/1951 Today's Date: 07/22/2013    History of present illness Benjamin [name] is a 63 y.o. male who reported that he had a stroke in October of last year that affected his left side. He was ambulating with a walker. He noted that when he awakened the morning of 07/18/2013 he was unable to get to the bathroom with his walker like he usually does and felt that he was weaker on the left. He presented at that time for evaluation.    OT comments  Pt continues to be reluctant to participate in EOB and OOB activity.  Able to state that he needed to be able to walk to go home, but continuing to decline progressing in mobility.  Follow Up Recommendations  SNF;Supervision/Assistance - 24 hour    Equipment Recommendations       Recommendations for Other Services      Precautions / Restrictions Precautions Precautions: Fall       Mobility Bed Mobility Overal bed mobility: Needs Assistance Bed Mobility: Supine to Sit;Sit to Supine     Supine to sit: Min assist Sit to supine: Supervision   General bed mobility comments: able to scoot hips to EOB, hand held assist to go from supine to sit  Transfers                      Balance Overall balance assessment: Needs assistance Sitting-balance support: Feet supported Sitting balance-Leahy Scale: Fair Sitting balance - Comments: sat EOB x 10 minutes with encouragement Postural control:  (attempts to lay back down)                         ADL Eating/Feeding: Supervision/ safety;Bed level (ice cream) Grooming: Oral care;Minimal assistance;Bed level   Upper Body Dressing : Minimal assistance;Bed level Lower Body Bathing: Sitting/lateral leans;Total assistance (washed feet) Lower Body Dressing: Total assistance;Sitting/lateral leans         General ADL Comments: pt needing encouragement to participate, agreeable to EOB and bed  level ADL only      Vision                     Perception     Praxis      Cognition   Behavior During Therapy: [facility] for tasks assessed/performed Overall Cognitive Status: History of cognitive impairments - at baseline Area of Impairment: Awareness;Safety/judgement          Safety/Judgement: Decreased awareness of safety;Decreased awareness of deficits Awareness: Emergent        Extremity/Trunk Assessment               Exercises       General Comments      Pertinent Vitals/ Pain      VSS, no pain  Home Living                                          Prior Functioning/Environment              Frequency Min 2X/week     Progress Toward Goals  OT Goals(current goals can now be found in the care plan section)  Progress towards OT goals: Not progressing toward goals - comment (self limiting)  Acute Rehab OT Goals Patient Stated Goal: go home, walk  Plan Discharge plan remains appropriate    End of Session    Activity Tolerance Patient tolerated treatment well   Patient Left in bed;with call bell/phone within reach;with bed alarm set   Nurse Communication  (ok to give ice cream)        Time: 9629-52841315-1345 OT Time Calculation (min): 30 min  Charges: OT General Charges $OT Visit: 1 Procedure OT Treatments $Self Care/Home Management : 23-37 mins  Evern BioMayberry, [place] 07/22/2013, 2:33 PM [phone]

## 2013-07-22 NOTE — Progress Notes (Signed)
UR COMPLETED  

## 2013-07-23 LAB — CULTURE, BLOOD (ROUTINE X 2)
Culture: NO GROWTH
Culture: NO GROWTH

## 2013-10-23 ENCOUNTER — Ambulatory Visit
Admit: 2013-10-23 | Disposition: A | Discharge status: Home / Self Care | Payer: Self-pay | Attending: Nurse Practitioner | Admitting: Nurse Practitioner

## 2013-10-23 ENCOUNTER — Ambulatory Visit: Payer: Self-pay | Admitting: Internal Medicine

## 2013-10-24 ENCOUNTER — Encounter (INDEPENDENT_AMBULATORY_CARE_PROVIDER_SITE_OTHER): Payer: Self-pay

## 2013-10-24 ENCOUNTER — Ambulatory Visit (INDEPENDENT_AMBULATORY_CARE_PROVIDER_SITE_OTHER): Payer: Medicaid Other | Admitting: Nurse Practitioner

## 2013-10-24 ENCOUNTER — Encounter: Payer: Self-pay | Admitting: Nurse Practitioner

## 2013-10-24 VITALS — BP 115/78 | HR 102

## 2013-10-24 DIAGNOSIS — R29898 Other symptoms and signs involving the musculoskeletal system: Secondary | ICD-10-CM

## 2013-10-24 DIAGNOSIS — I69322 Dysarthria following cerebral infarction: Secondary | ICD-10-CM

## 2013-10-24 DIAGNOSIS — I6330 Cerebral infarction due to thrombosis of unspecified cerebral artery: Secondary | ICD-10-CM

## 2013-10-24 DIAGNOSIS — I6339 Cerebral infarction due to thrombosis of other cerebral artery: Secondary | ICD-10-CM

## 2013-10-24 DIAGNOSIS — R1314 Dysphagia, pharyngoesophageal phase: Secondary | ICD-10-CM

## 2013-10-24 DIAGNOSIS — I69922 Dysarthria following unspecified cerebrovascular disease: Secondary | ICD-10-CM

## 2013-10-24 NOTE — Patient Instructions (Signed)
Continue clopidogrel 75 mg orally every day  for secondary stroke prevention and maintain strict control of hypertension with blood pressure goal below 130/90, and lipids with LDL cholesterol goal below 100 mg/dL. Followup in the future in 6 months, sooner as needed.

## 2013-10-24 NOTE — Progress Notes (Signed)
**Note Benjamin-Identified via Obfuscation** PATIENT: Benjamin [name] DOB: May 12, 1950  REASON FOR VISIT: hospital follow up for stroke HISTORY FROM: patient  HISTORY OF PRESENT ILLNESS: Benjamin [name] is a 63 y.o. male who comes for her first hospital visit post hospital discharge for stroke. He is accompanied today with his sister. She reports that he had strokes in December 2010, March 2013, and October 2014 previously. The stroke in October of last year affected his left side and his speech. He was ambulating with a walker. He noted that when he awakened the morning of 07/18/2013 he was unable to get to the bathroom with his walker like he usually does and felt that he was weaker on the left. He presented at that time for evaluation. Patient was on an aspirin a day at home for stroke prophylaxis. Benjamin [name] showed acute on chronic small vessel ischemia in the right corona radiata and right caudate nucleus. Stable and negative intracranial MRA except for relatively mild large vessel atherosclerosis.  2D Echocardiogram ejection fraction 55-60%. No cardiac source of emboli identified. Carotid Doppler showed no significant stenosis. Skilled nursing facility placement was recommended and [facility] [place]. He's been on a dysphagia 3 diet with thickened liquids. The first weeks after hospital discharge he received physical and occupational therapies but after the visits ran out he has regressed and is mostly chair bound. He was able to walk with a walker and assistance previously but sister thinks he's not able to do that now. He has a very minimal med list, not even needing hypertensive medication. His blood pressure in office today is 115/78. He is a former smoker but hasn't smoked since 2014. He is tolerating Plavix well without significant bruising or bleeding.  REVIEW OF SYSTEMS: Full 14 system review of systems performed and notable only for: Weight loss, trouble swallowing, moles, cough, incontinence,  feeling cold, joint pain, weakness, slurred speech, difficulty swallowing, change in appetite, disinterest in activities.  ALLERGIES: No Known Allergies  HOME MEDICATIONS: Outpatient Prescriptions Prior to Visit  Medication Sig Dispense Refill  . atorvastatin (LIPITOR) 20 MG tablet Take 1 tablet (20 mg total) by mouth daily at 6 PM.  30 tablet  1  . clopidogrel (PLAVIX) 75 MG tablet Take 1 tablet (75 mg total) by mouth daily with breakfast.  30 tablet  1  . senna-docusate (SENOKOT-S) 8.6-50 MG per tablet Take 1 tablet by mouth at bedtime as needed for mild constipation.  30 tablet  0  . tamsulosin (FLOMAX) 0.4 MG CAPS capsule Take 1 capsule (0.4 mg total) by mouth daily after supper.  30 capsule  1  . cefUROXime (CEFTIN) 500 MG tablet Take 1 tablet (500 mg total) by mouth 2 (two) times daily with a meal.  6 tablet  0  . docusate sodium 100 MG CAPS Take 100 mg by mouth 2 (two) times daily.  10 capsule  0  . furosemide (LASIX) 20 MG tablet Take 20 mg by mouth.      . Maltodextrin-Xanthan Gum (RESOURCE THICKENUP CLEAR) POWD Take 120 g by mouth as needed.  1 Can  1  . polyethylene glycol (MIRALAX / GLYCOLAX) packet Take 17 g by mouth daily.  14 each  0   No facility-administered medications prior to visit.    PHYSICAL EXAM Filed Vitals:   10/24/13 1442  BP: 115/78  Pulse: 102   Body mass index is 0.00 kg/(m^2). No exam data present  Generalized: Frail elderly AA  male, no acute distress. Head: normocephalic and atraumatic. Oropharynx benign  Neck: Supple, no carotid bruits  Cardiac: Tachycardic, Regular rhythm, no murmur  Musculoskeletal: No deformity   Neurological examination  Mentation: Alert oriented to time, place, history taking. Follows all commands, Speech fluent but dysarthric Cranial nerve II-XII: Fundoscopic exam not done. Pupils were equal round reactive to light extraocular movements were full, visual field were full on confrontational test.  Left facial droop, facial  light touch sensation normal bilaterally, hearing was intact to finger rubbing bilaterally.  Head turning and shoulder shrug and were normal and symmetric.Tongue deviates to the left. Motor: The motor testing reveals 5/5 strength on right and 4/5 strength on left. Increased motor tone is noted on the left.  Sensory: Sensory testing is intact to soft touch on all 4 extremities. No evidence of extinction is noted.  Coordination: Cerebellar testing reveals good finger-nose-finger bilaterally.  Gait and station: Gait is not tested, in wheelchair Reflexes: Deep tendon reflexes are brisker on the left. Left plantar upgoing.    ASSESSMENT: Benjamin [name] is a 63 y.o. AA male with PMH of strokes in Dec. 2010, March 2013, Oct. 2014 who presented with increased weakness on the left in March 2015. 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  Patient with resultant left hemiparesis, dysarthria and dysphagia.  In SNF with hopes for rehabilitation.  PLAN: I had a long discussion with the patient and sister regarding his recent stroke, discussed results of evaluation in the hospital and answered questions. Continue clopidogrel 75 mg orally every day  for secondary stroke prevention and maintain strict control of hypertension with blood pressure goal below 130/90, and lipids with LDL cholesterol goal below 100 mg/dL. Followup in the future in 6 months, sooner as needed.  Ronal FearLYNN E. Javarius Tsosie, MSN, NP-C 10/24/2013, 3:13 PM [facility] [address], [address] [place], [place] [address] [phone]  Note: This document was prepared with digital dictation and possible smart phrase technology. Any transcriptional errors that result from this process are unintentional.

## 2013-10-25 NOTE — Progress Notes (Signed)
I agree with above 

## 2013-10-29 LAB — COMPREHENSIVE METABOLIC PANEL
ALT: 18 U/L (ref 12–78)
Albumin: 3.3 g/dL — ABNORMAL LOW (ref 3.4–5.0)
Alkaline Phosphatase: 182 U/L — ABNORMAL HIGH
Anion Gap: 6 — ABNORMAL LOW (ref 7–16)
BUN: 16 mg/dL (ref 7–18)
Bilirubin,Total: 0.3 mg/dL (ref 0.2–1.0)
Calcium, Total: 9.2 mg/dL (ref 8.5–10.1)
Chloride: 106 mmol/L (ref 98–107)
Co2: 29 mmol/L (ref 21–32)
Creatinine: 0.77 mg/dL (ref 0.60–1.30)
EGFR (African American): >60
EGFR (Non-African Amer.): >60
Glucose: 109 mg/dL — ABNORMAL HIGH (ref 65–99)
Osmolality: 283 (ref 275–301)
POTASSIUM: 3.6 mmol/L (ref 3.5–5.1)
SGOT(AST): 17 U/L (ref 15–37)
Sodium: 141 mmol/L (ref 136–145)
Total Protein: 7.5 g/dL (ref 6.4–8.2)

## 2013-10-29 LAB — CBC
HCT: 38.3 % — AB (ref 40.0–52.0)
HGB: 12.2 g/dL — ABNORMAL LOW (ref 13.0–18.0)
MCH: 29.2 pg (ref 26.0–34.0)
MCHC: 32 g/dL (ref 32.0–36.0)
MCV: 91 fL (ref 80–100)
Platelet: 183 10*3/uL (ref 150–440)
RBC: 4.2 10*6/uL — ABNORMAL LOW (ref 4.40–5.90)
RDW: 14.2 % (ref 11.5–14.5)
WBC: 6.5 10*3/uL (ref 3.8–10.6)

## 2013-10-29 LAB — APTT: ACTIVATED PTT: 27.7 s (ref 23.6–35.9)

## 2013-10-30 ENCOUNTER — Inpatient Hospital Stay: Payer: Self-pay | Admitting: Family Medicine

## 2013-10-31 LAB — CBC WITH DIFFERENTIAL/PLATELET
BASOS PCT: 0.4 %
Basophil #: 0 10*3/uL (ref 0.0–0.1)
EOS ABS: 0.1 10*3/uL (ref 0.0–0.7)
Eosinophil %: 0.9 %
HCT: 36.1 % — AB (ref 40.0–52.0)
HGB: 11.7 g/dL — ABNORMAL LOW (ref 13.0–18.0)
Lymphocyte #: 1.6 10*3/uL (ref 1.0–3.6)
Lymphocyte %: 20 %
MCH: 30.3 pg (ref 26.0–34.0)
MCHC: 32.3 g/dL (ref 32.0–36.0)
MCV: 94 fL (ref 80–100)
MONO ABS: 0.6 x10 3/mm (ref 0.2–1.0)
MONOS PCT: 7.7 %
Neutrophil #: 5.5 10*3/uL (ref 1.4–6.5)
Neutrophil %: 71 %
PLATELETS: 183 10*3/uL (ref 150–440)
RBC: 3.86 10*6/uL — AB (ref 4.40–5.90)
RDW: 14 % (ref 11.5–14.5)
WBC: 7.7 10*3/uL (ref 3.8–10.6)

## 2013-10-31 LAB — BASIC METABOLIC PANEL
ANION GAP: 7 (ref 7–16)
BUN: 14 mg/dL (ref 7–18)
CHLORIDE: 101 mmol/L (ref 98–107)
CREATININE: 0.77 mg/dL (ref 0.60–1.30)
Calcium, Total: 9.6 mg/dL (ref 8.5–10.1)
Co2: 30 mmol/L (ref 21–32)
EGFR (Non-African Amer.): >60
Glucose: 82 mg/dL (ref 65–99)
OSMOLALITY: 275 (ref 275–301)
POTASSIUM: 3.8 mmol/L (ref 3.5–5.1)
Sodium: 138 mmol/L (ref 136–145)

## 2013-11-19 ENCOUNTER — Inpatient Hospital Stay: Payer: Self-pay | Admitting: Internal Medicine

## 2013-11-19 LAB — CK TOTAL AND CKMB (NOT AT ARMC)
CK, TOTAL: 37 U/L — AB
CK, Total: 30 U/L — ABNORMAL LOW
CK-MB: 1.5 ng/mL (ref 0.5–3.6)
CK-MB: 1.7 ng/mL (ref 0.5–3.6)

## 2013-11-19 LAB — COMPREHENSIVE METABOLIC PANEL
ALBUMIN: 3.4 g/dL (ref 3.4–5.0)
ANION GAP: 9 (ref 7–16)
AST: 21 U/L (ref 15–37)
Alkaline Phosphatase: 200 U/L — ABNORMAL HIGH
BUN: 20 mg/dL — ABNORMAL HIGH (ref 7–18)
Bilirubin,Total: 0.7 mg/dL (ref 0.2–1.0)
CREATININE: 1.03 mg/dL (ref 0.60–1.30)
Calcium, Total: 9.6 mg/dL (ref 8.5–10.1)
Chloride: 104 mmol/L (ref 98–107)
Co2: 26 mmol/L (ref 21–32)
EGFR (African American): >60
GLUCOSE: 148 mg/dL — AB (ref 65–99)
Osmolality: 283 (ref 275–301)
Potassium: 3.9 mmol/L (ref 3.5–5.1)
SGPT (ALT): 18 U/L
SODIUM: 139 mmol/L (ref 136–145)
TOTAL PROTEIN: 8.4 g/dL — AB (ref 6.4–8.2)

## 2013-11-19 LAB — URINALYSIS, COMPLETE
BACTERIA: NONE SEEN
Bilirubin,UR: NEGATIVE
Blood: NEGATIVE
GLUCOSE, UR: NEGATIVE mg/dL (ref 0–75)
Ketone: NEGATIVE
Nitrite: NEGATIVE
Ph: 5 (ref 4.5–8.0)
Protein: 30
Specific Gravity: 1.026 (ref 1.003–1.030)
Squamous Epithelial: 2
WBC UR: 286 /HPF (ref 0–5)

## 2013-11-19 LAB — PRO B NATRIURETIC PEPTIDE: B-Type Natriuretic Peptide: 883 pg/mL — ABNORMAL HIGH (ref 0–125)

## 2013-11-19 LAB — CBC WITH DIFFERENTIAL/PLATELET
BASOS PCT: 0.4 %
Basophil #: 0 10*3/uL (ref 0.0–0.1)
EOS PCT: 0.3 %
Eosinophil #: 0 10*3/uL (ref 0.0–0.7)
HCT: 40.1 % (ref 40.0–52.0)
HGB: 12.9 g/dL — ABNORMAL LOW (ref 13.0–18.0)
LYMPHS PCT: 13.5 %
Lymphocyte #: 1.2 10*3/uL (ref 1.0–3.6)
MCH: 29.7 pg (ref 26.0–34.0)
MCHC: 32.2 g/dL (ref 32.0–36.0)
MCV: 92 fL (ref 80–100)
MONOS PCT: 6.7 %
Monocyte #: 0.6 x10 3/mm (ref 0.2–1.0)
Neutrophil #: 6.9 10*3/uL — ABNORMAL HIGH (ref 1.4–6.5)
Neutrophil %: 79.1 %
Platelet: 245 10*3/uL (ref 150–440)
RBC: 4.35 10*6/uL — AB (ref 4.40–5.90)
RDW: 13.7 % (ref 11.5–14.5)
WBC: 8.8 10*3/uL (ref 3.8–10.6)

## 2013-11-19 LAB — PROTIME-INR
INR: 1.1
PROTHROMBIN TIME: 14.5 s (ref 11.5–14.7)

## 2013-11-19 LAB — TROPONIN I
Troponin-I: 0.1 ng/mL — ABNORMAL HIGH
Troponin-I: 0.08 ng/mL — ABNORMAL HIGH
Troponin-I: 0.04 ng/mL

## 2013-11-19 LAB — LIPASE, BLOOD: Lipase: 136 U/L (ref 73–393)

## 2013-11-19 LAB — MAGNESIUM: Magnesium: 1.7 mg/dL — ABNORMAL LOW

## 2013-11-19 LAB — PHOSPHORUS: PHOSPHORUS: 2.7 mg/dL (ref 2.5–4.9)

## 2013-11-20 LAB — CBC WITH DIFFERENTIAL/PLATELET
Basophil #: 0 10*3/uL (ref 0.0–0.1)
Basophil %: 0.1 %
Eosinophil #: 0 10*3/uL (ref 0.0–0.7)
Eosinophil %: 0 %
HCT: 35.7 % — ABNORMAL LOW (ref 40.0–52.0)
HGB: 11.5 g/dL — ABNORMAL LOW (ref 13.0–18.0)
LYMPHS ABS: 0.8 10*3/uL — AB (ref 1.0–3.6)
Lymphocyte %: 6.4 %
MCH: 29.3 pg (ref 26.0–34.0)
MCHC: 32.1 g/dL (ref 32.0–36.0)
MCV: 91 fL (ref 80–100)
MONOS PCT: 2.5 %
Monocyte #: 0.3 x10 3/mm (ref 0.2–1.0)
Neutrophil #: 11.3 10*3/uL — ABNORMAL HIGH (ref 1.4–6.5)
Neutrophil %: 91 %
Platelet: 216 10*3/uL (ref 150–440)
RBC: 3.92 10*6/uL — ABNORMAL LOW (ref 4.40–5.90)
RDW: 13.5 % (ref 11.5–14.5)
WBC: 12.4 10*3/uL — ABNORMAL HIGH (ref 3.8–10.6)

## 2013-11-20 LAB — BASIC METABOLIC PANEL
Anion Gap: 10 (ref 7–16)
BUN: 23 mg/dL — ABNORMAL HIGH (ref 7–18)
Calcium, Total: 9.3 mg/dL (ref 8.5–10.1)
Chloride: 102 mmol/L (ref 98–107)
Co2: 26 mmol/L (ref 21–32)
Creatinine: 0.92 mg/dL (ref 0.60–1.30)
EGFR (African American): >60
EGFR (Non-African Amer.): >60
Glucose: 144 mg/dL — ABNORMAL HIGH (ref 65–99)
Osmolality: 282 (ref 275–301)
Potassium: 3.7 mmol/L (ref 3.5–5.1)
Sodium: 138 mmol/L (ref 136–145)

## 2013-11-20 LAB — URINE CULTURE

## 2013-11-23 ENCOUNTER — Ambulatory Visit: Payer: Self-pay | Admitting: Internal Medicine

## 2013-11-23 LAB — COMPREHENSIVE METABOLIC PANEL
ALBUMIN: 2.7 g/dL — AB (ref 3.4–5.0)
ALT: 13 U/L — AB
AST: 21 U/L (ref 15–37)
Alkaline Phosphatase: 110 U/L
Anion Gap: 7 (ref 7–16)
BUN: 21 mg/dL — AB (ref 7–18)
Bilirubin,Total: 0.7 mg/dL (ref 0.2–1.0)
Calcium, Total: 8.7 mg/dL (ref 8.5–10.1)
Chloride: 106 mmol/L (ref 98–107)
Co2: 28 mmol/L (ref 21–32)
Creatinine: 0.81 mg/dL (ref 0.60–1.30)
EGFR (African American): >60
GLUCOSE: 86 mg/dL (ref 65–99)
Osmolality: 284 (ref 275–301)
Potassium: 3.3 mmol/L — ABNORMAL LOW (ref 3.5–5.1)
Sodium: 141 mmol/L (ref 136–145)
Total Protein: 6.6 g/dL (ref 6.4–8.2)

## 2013-11-23 LAB — MAGNESIUM: Magnesium: 2.2 mg/dL

## 2013-11-23 LAB — PHOSPHORUS: Phosphorus: 1.7 mg/dL — ABNORMAL LOW (ref 2.5–4.9)

## 2013-11-24 LAB — CULTURE, BLOOD (SINGLE)

## 2013-12-04 ENCOUNTER — Emergency Department: Payer: Self-pay | Admitting: Emergency Medicine

## 2013-12-04 LAB — COMPREHENSIVE METABOLIC PANEL
ALK PHOS: 146 U/L — AB
Albumin: 3 g/dL — ABNORMAL LOW (ref 3.4–5.0)
Anion Gap: 10 (ref 7–16)
BUN: 15 mg/dL (ref 7–18)
Bilirubin,Total: 0.3 mg/dL (ref 0.2–1.0)
CALCIUM: 9.3 mg/dL (ref 8.5–10.1)
Chloride: 99 mmol/L (ref 98–107)
Co2: 31 mmol/L (ref 21–32)
Creatinine: 0.78 mg/dL (ref 0.60–1.30)
EGFR (African American): >60
GLUCOSE: 143 mg/dL — AB (ref 65–99)
Osmolality: 283 (ref 275–301)
Potassium: 3.8 mmol/L (ref 3.5–5.1)
SGOT(AST): 24 U/L (ref 15–37)
SGPT (ALT): 17 U/L
Sodium: 140 mmol/L (ref 136–145)
Total Protein: 7.5 g/dL (ref 6.4–8.2)

## 2013-12-04 LAB — CBC
HCT: 36.5 % — AB (ref 40.0–52.0)
HGB: 11.5 g/dL — ABNORMAL LOW (ref 13.0–18.0)
MCH: 28.8 pg (ref 26.0–34.0)
MCHC: 31.4 g/dL — AB (ref 32.0–36.0)
MCV: 92 fL (ref 80–100)
PLATELETS: 236 10*3/uL (ref 150–440)
RBC: 3.98 10*6/uL — ABNORMAL LOW (ref 4.40–5.90)
RDW: 13.8 % (ref 11.5–14.5)
WBC: 8.4 10*3/uL (ref 3.8–10.6)

## 2013-12-04 LAB — TROPONIN I: Troponin-I: <0.02 ng/mL

## 2013-12-04 LAB — CK TOTAL AND CKMB (NOT AT ARMC)
CK, Total: 17 U/L — ABNORMAL LOW
CK-MB: 1.5 ng/mL (ref 0.5–3.6)

## 2013-12-11 ENCOUNTER — Inpatient Hospital Stay: Payer: Self-pay | Admitting: Internal Medicine

## 2013-12-11 LAB — URINALYSIS, COMPLETE
Bilirubin,UR: NEGATIVE
Blood: NEGATIVE
Glucose,UR: NEGATIVE mg/dL (ref 0–75)
KETONE: NEGATIVE
Leukocyte Esterase: NEGATIVE
NITRITE: NEGATIVE
PROTEIN: NEGATIVE
Ph: 5 (ref 4.5–8.0)
RBC,UR: 2 /HPF (ref 0–5)
Specific Gravity: 1.016 (ref 1.003–1.030)
Squamous Epithelial: <1
WBC UR: 2 /HPF (ref 0–5)

## 2013-12-11 LAB — COMPREHENSIVE METABOLIC PANEL
ALBUMIN: 3 g/dL — AB (ref 3.4–5.0)
AST: 29 U/L (ref 15–37)
Alkaline Phosphatase: 156 U/L — ABNORMAL HIGH
Anion Gap: 12 (ref 7–16)
BUN: 16 mg/dL (ref 7–18)
Bilirubin,Total: 0.3 mg/dL (ref 0.2–1.0)
CO2: 28 mmol/L (ref 21–32)
CREATININE: 0.77 mg/dL (ref 0.60–1.30)
Calcium, Total: 9.3 mg/dL (ref 8.5–10.1)
Chloride: 100 mmol/L (ref 98–107)
GLUCOSE: 130 mg/dL — AB (ref 65–99)
Osmolality: 282 (ref 275–301)
POTASSIUM: 4.1 mmol/L (ref 3.5–5.1)
SGPT (ALT): 23 U/L
SODIUM: 140 mmol/L (ref 136–145)
Total Protein: 7.8 g/dL (ref 6.4–8.2)

## 2013-12-11 LAB — CBC WITH DIFFERENTIAL/PLATELET
Basophil #: 0 10*3/uL (ref 0.0–0.1)
Basophil %: 0.6 %
Eosinophil #: 0.1 10*3/uL (ref 0.0–0.7)
Eosinophil %: 1.6 %
HCT: 34.6 % — ABNORMAL LOW (ref 40.0–52.0)
HGB: 11.4 g/dL — AB (ref 13.0–18.0)
LYMPHS ABS: 1.5 10*3/uL (ref 1.0–3.6)
LYMPHS PCT: 20.1 %
MCH: 30 pg (ref 26.0–34.0)
MCHC: 32.9 g/dL (ref 32.0–36.0)
MCV: 91 fL (ref 80–100)
Monocyte #: 0.6 x10 3/mm (ref 0.2–1.0)
Monocyte %: 8.6 %
NEUTROS ABS: 5.1 10*3/uL (ref 1.4–6.5)
NEUTROS PCT: 69.1 %
Platelet: 223 10*3/uL (ref 150–440)
RBC: 3.79 10*6/uL — AB (ref 4.40–5.90)
RDW: 13.8 % (ref 11.5–14.5)
WBC: 7.4 10*3/uL (ref 3.8–10.6)

## 2013-12-11 LAB — PROTIME-INR
INR: 1
Prothrombin Time: 13.1 secs (ref 11.5–14.7)

## 2013-12-11 LAB — PHOSPHORUS: Phosphorus: 3.2 mg/dL (ref 2.5–4.9)

## 2013-12-11 LAB — TROPONIN I: Troponin-I: <0.02 ng/mL

## 2013-12-11 LAB — MAGNESIUM: MAGNESIUM: 1.9 mg/dL

## 2013-12-12 LAB — VANCOMYCIN, TROUGH: Vancomycin, Trough: 15 ug/mL (ref 10–20)

## 2013-12-12 LAB — URINE CULTURE

## 2013-12-14 LAB — VANCOMYCIN, TROUGH: Vancomycin, Trough: 29 ug/mL (ref 10–20)

## 2013-12-14 LAB — CREATININE, SERUM
Creatinine: 0.84 mg/dL (ref 0.60–1.30)
EGFR (African American): >60
EGFR (Non-African Amer.): >60

## 2013-12-15 LAB — VANCOMYCIN, RANDOM: Vancomycin, Random: 9 ug/mL

## 2013-12-15 LAB — CREATININE, SERUM
Creatinine: 0.91 mg/dL (ref 0.60–1.30)
EGFR (African American): >60
EGFR (Non-African Amer.): >60

## 2013-12-16 LAB — VANCOMYCIN, TROUGH: Vancomycin, Trough: 13 ug/mL (ref 10–20)

## 2013-12-16 LAB — CULTURE, BLOOD (SINGLE)

## 2013-12-25 ENCOUNTER — Emergency Department: Payer: Self-pay | Admitting: Emergency Medicine

## 2013-12-25 LAB — COMPREHENSIVE METABOLIC PANEL
ALBUMIN: 3.1 g/dL — AB (ref 3.4–5.0)
ALK PHOS: 175 U/L — AB
ALT: 42 U/L
ANION GAP: 2 — AB (ref 7–16)
AST: 36 U/L (ref 15–37)
BILIRUBIN TOTAL: 0.3 mg/dL (ref 0.2–1.0)
BUN: 13 mg/dL (ref 7–18)
CHLORIDE: 106 mmol/L (ref 98–107)
Calcium, Total: 9.5 mg/dL (ref 8.5–10.1)
Co2: 29 mmol/L (ref 21–32)
Creatinine: 0.72 mg/dL (ref 0.60–1.30)
GLUCOSE: 87 mg/dL (ref 65–99)
Osmolality: 273 (ref 275–301)
Potassium: 3.8 mmol/L (ref 3.5–5.1)
Sodium: 137 mmol/L (ref 136–145)
Total Protein: 7.5 g/dL (ref 6.4–8.2)

## 2013-12-25 LAB — CBC
HCT: 37 % — ABNORMAL LOW (ref 40.0–52.0)
HGB: 11.5 g/dL — ABNORMAL LOW (ref 13.0–18.0)
MCH: 28.4 pg (ref 26.0–34.0)
MCHC: 31 g/dL — ABNORMAL LOW (ref 32.0–36.0)
MCV: 92 fL (ref 80–100)
Platelet: 240 10*3/uL (ref 150–440)
RBC: 4.04 10*6/uL — ABNORMAL LOW (ref 4.40–5.90)
RDW: 14.6 % — ABNORMAL HIGH (ref 11.5–14.5)
WBC: 11.4 10*3/uL — AB (ref 3.8–10.6)

## 2013-12-25 LAB — PRO B NATRIURETIC PEPTIDE: B-Type Natriuretic Peptide: 30 pg/mL (ref 0–125)

## 2013-12-25 LAB — TROPONIN I: Troponin-I: <0.02 ng/mL

## 2014-01-29 ENCOUNTER — Ambulatory Visit: Payer: Self-pay | Admitting: Family Medicine

## 2014-01-29 LAB — CBC WITH DIFFERENTIAL/PLATELET
BASOS PCT: 0.3 %
Basophil #: 0 10*3/uL (ref 0.0–0.1)
EOS ABS: 0.2 10*3/uL (ref 0.0–0.7)
EOS PCT: 2.8 %
HCT: 35.2 % — AB (ref 40.0–52.0)
HGB: 11.2 g/dL — AB (ref 13.0–18.0)
LYMPHS ABS: 0.7 10*3/uL — AB (ref 1.0–3.6)
LYMPHS PCT: 8.2 %
MCH: 29.6 pg (ref 26.0–34.0)
MCHC: 31.9 g/dL — AB (ref 32.0–36.0)
MCV: 93 fL (ref 80–100)
MONOS PCT: 8.4 %
Monocyte #: 0.7 x10 3/mm (ref 0.2–1.0)
Neutrophil #: 7 10*3/uL — ABNORMAL HIGH (ref 1.4–6.5)
Neutrophil %: 80.3 %
Platelet: 166 10*3/uL (ref 150–440)
RBC: 3.79 10*6/uL — ABNORMAL LOW (ref 4.40–5.90)
RDW: 15.5 % — AB (ref 11.5–14.5)
WBC: 8.7 10*3/uL (ref 3.8–10.6)

## 2014-01-29 LAB — BASIC METABOLIC PANEL
BUN: 21 mg/dL — AB (ref 7–18)
CALCIUM: 9 mg/dL (ref 8.5–10.1)
Chloride: 108 mmol/L — ABNORMAL HIGH (ref 98–107)
Co2: 29 mmol/L (ref 21–32)
Creatinine: 0.78 mg/dL (ref 0.60–1.30)
EGFR (Non-African Amer.): >60
Glucose: 181 mg/dL — ABNORMAL HIGH (ref 65–99)
OSMOLALITY: 272 (ref 275–301)
Potassium: 3.5 mmol/L (ref 3.5–5.1)
Sodium: 132 mmol/L — ABNORMAL LOW (ref 136–145)

## 2014-03-07 ENCOUNTER — Encounter: Payer: Self-pay | Admitting: Neurology

## 2014-04-29 ENCOUNTER — Ambulatory Visit: Payer: Medicaid Other | Admitting: Nurse Practitioner

## 2014-05-06 ENCOUNTER — Ambulatory Visit: Payer: Self-pay

## 2014-05-06 LAB — CBC WITH DIFFERENTIAL/PLATELET
BASOS ABS: 0 10*3/uL (ref 0.0–0.1)
Basophil %: 0.4 %
EOS ABS: 0 10*3/uL (ref 0.0–0.7)
Eosinophil %: 0.2 %
HCT: 36.5 % — ABNORMAL LOW (ref 40.0–52.0)
HGB: 11.7 g/dL — ABNORMAL LOW (ref 13.0–18.0)
LYMPHS PCT: 4.3 %
Lymphocyte #: 0.3 10*3/uL — ABNORMAL LOW (ref 1.0–3.6)
MCH: 29.2 pg (ref 26.0–34.0)
MCHC: 32.1 g/dL (ref 32.0–36.0)
MCV: 91 fL (ref 80–100)
MONO ABS: 1 x10 3/mm (ref 0.2–1.0)
Monocyte %: 13 %
NEUTROS PCT: 82.1 %
Neutrophil #: 6.4 10*3/uL (ref 1.4–6.5)
Platelet: 132 10*3/uL — ABNORMAL LOW (ref 150–440)
RBC: 4.01 10*6/uL — ABNORMAL LOW (ref 4.40–5.90)
RDW: 14.7 % — ABNORMAL HIGH (ref 11.5–14.5)
WBC: 7.8 10*3/uL (ref 3.8–10.6)

## 2014-05-07 ENCOUNTER — Ambulatory Visit: Payer: Self-pay

## 2014-05-07 LAB — BASIC METABOLIC PANEL
ANION GAP: 6 — AB (ref 7–16)
BUN: 17 mg/dL (ref 7–18)
Calcium, Total: 8.6 mg/dL (ref 8.5–10.1)
Chloride: 103 mmol/L (ref 98–107)
Co2: 29 mmol/L (ref 21–32)
Creatinine: 0.86 mg/dL (ref 0.60–1.30)
EGFR (African American): >60
Glucose: 152 mg/dL — ABNORMAL HIGH (ref 65–99)
OSMOLALITY: 280 (ref 275–301)
POTASSIUM: 3.9 mmol/L (ref 3.5–5.1)
SODIUM: 138 mmol/L (ref 136–145)

## 2014-05-29 ENCOUNTER — Ambulatory Visit: Payer: Medicaid Other | Admitting: Neurology

## 2014-06-09 ENCOUNTER — Ambulatory Visit: Payer: Self-pay | Admitting: Neurology

## 2014-07-10 ENCOUNTER — Ambulatory Visit: Payer: Self-pay | Admitting: Neurology

## 2014-08-16 NOTE — Consult Note (Signed)
Pt air flow better than eariler today when NP examined him.  Now has bilateral good breath sounds.  With this improvement we could proceed with PEG tomorrow or Friday depending on getting an available doctor to assist in placement.  Electronic Signatures: Scot JunElliott, Robert T (MD)  (Signed on 29-Jul-15 19:11)  Authored  Last Updated: 29-Jul-15 19:11 by Scot JunElliott, Robert T (MD)

## 2014-08-16 NOTE — H&P (Signed)
PATIENT NAME:  Benjamin [name], Benjamin [name] MR#:  [id number] DATE OF BIRTH:  [birth year]  DATE OF ADMISSION:  10/30/2013  REFERRING PHYSICIAN: Paduchowski.   PRIMARY CARE PHYSICIAN: Nonlocal.   CHIEF COMPLAINT: Shortness of breath.   HISTORY OF PRESENT ILLNESS: A 64 year old African American gentleman with history of CVA with residual left-sided weakness and dysphagia who is on pureed diet and thickened liquids at baseline, presenting with shortness of breath. He was in his normal state of health at his assisted nursing facility, [facility], eating which was followed promptly by coughing and choking. Noted to be short of breath. Called EMS. Noted to be saturating in the low 80s on room air; however, his status has improved after receiving supplemental O2 in the ER. Currently states no complaints.   REVIEW OF SYSTEMS:   CONSTITUTIONAL: Denies fever, fatigue, weakness.  EYES: Denies blurry vision, double vision or eye pain.  EARS, NOSE, THROAT: Denies tinnitus, ear pain, hearing loss.  RESPIRATORY: Positive for cough, shortness of breath as described above.  CARDIOVASCULAR: Denies chest pain, palpitations, edema.  GASTROINTESTINAL: Denies nausea, vomiting, diarrhea, abdominal pain.  GENITOURINARY: Denies dysuria or hematuria.  ENDOCRINE: Denies nocturia or thyroid problems.  HEMATOLOGIC AND LYMPHATIC: Denies easy bruising or bleeding.  SKIN: Denies rash or lesions.  MUSCULOSKELETAL: Denies pain in neck, back, shoulder, knees, hips or arthritic symptoms.  NEUROLOGIC: Denies paralysis, paresthesias.  PSYCHIATRIC: Denies anxiety or depressive symptoms.   Otherwise, full review of systems performed by me is negative.   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    SOCIAL HISTORY: No alcohol, tobacco or drug usage.   FAMILY HISTORY: Denies any cardiovascular or pulmonary disorders.   ALLERGIES: No known drug allergies.   HOME MEDICATIONS: Include  Flomax 0.4 mg p.o. at bedtime, mirtazapine 50 mg 1/2 tablet p.o. at bedtime, atorvastatin 20 mg p.o. at bedtime, Plavix 75 mg p.o. daily, Senna-S 50/8.6 mg 2 tabs p.o. daily.   PHYSICAL EXAMINATION:  VITAL SIGNS: Temperature 98.1, heart rate 113, respirations 24, blood pressure 136/94, saturating 94% on supplemental O2. Weight 58.2 kg, BMI 18.4.  GENERAL: Chronically ill, frail-appearing, African American gentleman, currently in no acute distress.  HEAD: Normocephalic, atraumatic.  EYES: Pupils equal, round and reactive to light. Extraocular muscles intact. No scleral icterus.  MOUTH: Moist mucosal membranes. Dentition poor. No abscess noted.  EARS, NOSE, THROAT: Clear without exudates. No external lesions.  NECK: Supple. No thyromegaly. No nodules. No JVD.  PULMONARY: Diminished breath sounds throughout all lung fields; however, no frank wheezes, rales or rhonchi.  CHEST: Nontender to palpation. Good air entry bilaterally.  CARDIOVASCULAR: S1, S2, regular rate and rhythm. No murmurs, rubs or gallops. No edema. Pedal pulses 2+ bilaterally.  GASTROINTESTINAL: Soft, nontender, nondistended. No masses. Positive bowel sounds. No hepatosplenomegaly.  MUSCULOSKELETAL: No swelling, clubbing or edema. Passive range of motion full in all extremities.  NEUROLOGIC: He has residual face droop as well as residual left-sided weakness in upper and lower extremities. Otherwise, cranial nerves II through XII intact. No other neurological deficits other than left-sided weakness as described above. Sensation intact. Reflexes intact.  SKIN: No ulceration, lesions, rash, cyanosis. Skin warm, dry. Turgor intact.  PSYCHIATRIC: Mood and affect within normal limits; however, somewhat delayed responses. He is awake, alert, oriented x 3. Insight and judgment intact.   LABORATORY DATA: Sodium 141, potassium 3.6, chloride 106, bicarb 29, BUN 16, creatinine 0.77, glucose 109. Albumin 3.3, alk phos 182, otherwise LFTs within  normal limits. WBC 6.5,  hemoglobin 12.2, platelets of 183. ABG performed, 7.41, CO2 of 43, O2 103 on nasal cannula. CT chest performed which reveals no evidence of PE. There are air-fluid levels in the esophagus which suggests dysmotility, as well as patchy ground-glass infiltrates in the lung bases representing likely aspiration.   ASSESSMENT AND PLAN: A 64 year old African American gentleman with history of cerebrovascular accident with residual dysphagia, presenting with shortness of breath after eating.  1. Acute respiratory failure with hypoxemia, with initial oxygen saturation noted to be around 85, with inability to speak in full sentences. This is secondary to aspiration. No indications of infection at this time. Most likely an aspiration pneumonitis. He received antibiotic, clindamycin, once in the Emergency Department. No indication for continuing this; however, if he becomes febrile or leukocytosis, would cover with Zosyn. Provide DuoNeb therapy q.4 hours. Supplemental oxygen to keep oxygen saturation greater than 92%, as well as incentive spirometry.  2. Aspiration: Will place n.p.o. Get a speech evaluation.  3. Hyperlipidemia: Continue statin therapy.  4. Cerebrovascular accident: History of Plavix with statin.  5. Venous thromboembolism prophylaxis with heparin subcutaneous.   The patient is FULL CODE.   TIME SPENT: 45 minutes.   ____________________________ Aaron Mose. Nila Winker, MD dkh:gb D: 10/30/2013 02:20:08 ET T: 10/30/2013 03:47:53 ET JOB#: [id number]  cc: Aaron Mose. Elvyn Krohn, MD, <Dictator> Mana Morison Woodfin Ganja MD ELECTRONICALLY SIGNED 11/01/2013 12:12

## 2014-08-16 NOTE — Discharge Summary (Signed)
PATIENT NAME:  Benjamin [name], Benjamin [name] MR#:  [id number] DATE OF BIRTH:  08/26/1950  DATE OF ADMISSION:  11/19/2013 DATE OF DISCHARGE:  11/23/2013   ADMITTING DIAGNOSIS: Acute respiratory failure.   DISCHARGE DIAGNOSES:  1. Acute respiratory failure, likely due to recurrent aspiration, although his chest x-ray was negative. The patient did improve with antibiotics.  2. Severe dysphagia, with previous history of cerebrovascular accident, status post PEG tube placement. The patient now strict n.p.o.  3. History of cerebrovascular accident with residual left-sided weakness.  4. Hyperlipidemia.  5. Benign prostatic hypertrophy.  6. Failure to thrive.  7. History of vitamin D deficiency.  8. Chronic constipation.   CONSULTANTS: GI.  PROCEDURES: Upper endoscopy with PEG tube placement.   PERTINENT LABORATORIES AND EVALUATIONS: Admitting glucose 148, BUN 20, creatinine 1.03, sodium 139, potassium 3.9, chloride 104, CO2 is 26, calcium 9.6, magnesium 1.7. LFTs were normal except a total protein of 8.4, albumin 3.4, bilirubin total 0.7, alkaline phosphatase 200, AST 21, ALT 18. Troponin 0.10, then 0.08, then 0.04. WBC 8.8, hemoglobin 12.9, platelet count was 245. INR 1.1. Urine cultures: No growth. Blood cultures: No growth. UA showed leukocytes 2+, WBCs 286. EKG showed sinus tachycardia with bilateral atrial enlargement, right bundle branch block. Chest x-ray showed no acute abnormality.   HOSPITAL COURSE: Please refer to H and P done by the admitting physician. The patient is a 64 year old African-American male with a large CVA with residual left-sided weakness, who was recently hospitalized here with aspiration. He was discharged to a skilled nursing facility on a modified diet. However, the patient continued to have difficulties with dysphagia. He was brought in, initially had to be placed on a BiPAP. His chest x-ray was negative. The patient was placed on antibiotics and IV Solu-Medrol initially and breathing  treatments, with significant improvement in his respiratory status. He is currently on room air. The patient underwent an endoscopy and had a PEG tube placement due to his severe dysphagia. He is tolerating his tube feeds well. At this time, he is stable to return back to the skilled nursing facility. He should receive all his medications via PEG tube and feedings via PEG tube. He should not be receiving any oral intake. At this time, he is stable for discharge.   DISCHARGE CODE STATUS: DNR.   DISCHARGE MEDICATIONS:  1. Mirtazapine 15 mg 1/2 tab at bedtime down PEG tube.  2. Senna 2 tabs daily as needed for constipation down PEG tube.  3. Atorvastatin 20 at bedtime down PEG tube. 4. Flomax 0.4 one tab daily PEG tube. 5. Levaquin 500 mg 1 tab daily for 7 days.  6. Plavix 75 mg daily down PEG tube.  7. Albuterol/ipratropium nebulizers q.4 as needed for wheezing or shortness of breath. 8. Scopolamine patch 1 patch topically every 3 days.  9. Free water at 250 mL 2 times a day down the PEG tube.  10. Jevity 1.5 240 mL 5 times a day at 8:00 a.m., 11:00 a.m., at 1400, 1700 and 2000, flush with water 50 mL with each feeding.  11. Protonix 40 daily down PEG tube.   DIET: N.p.o.   ACTIVITY: As tolerated. PT evaluation and treatment.   FOLLOWUP: Follow with primary MD in 1 to 2 weeks.   DISCHARGE INSTRUCTIONS: Please take aspiration precautions.    TIME SPENT: 35 minutes spent on the discharge.   ____________________________ Lacie Scotts. Allena Katz, MD shp:lb D: 11/23/2013 10:35:14 ET T: 11/23/2013 10:47:34 ET JOB#: [id number]  cc: Irby Fails H. Allena Katz, MD, <Dictator>  Charise CarwinSHREYANG H Quinnetta Roepke MD ELECTRONICALLY SIGNED 11/26/2013 8:28

## 2014-08-16 NOTE — Consult Note (Signed)
PATIENT NAME:  Benjamin [name], Benjamin [name] MR#:  [id number] DATE OF BIRTH:  July 01, 1950  DATE OF CONSULTATION:  11/20/2013  REFERRING PHYSICIAN:  Dr. Renae Gloss. CONSULTING PHYSICIAN:  Cala Bradford A. Arvilla Market, ANP (Adult Nurse Practitioner) with Dr. Manfred Shirts.  PRIMARY CARE PHYSICIAN:  Hope Budds, DO.  REASON FOR CONSULTATION: Request for new PEG tube placement.   HISTORY OF PRESENT ILLNESS: Mr. Coviello is a 64 year old African American male, who tells me he had a stroke in 2014. He was admitted to the hospital earlier this month for 2 days with aspiration pneumonia on pureed diet. He has residual left-sided weakness and dysphagia and has been a resident of [facility]. He was evaluated  by speech therapy and underwent a modified barium swallow study on 10/31/2013, and he was felt not to be appropriate to eat any type of food with dietary modification. The PEG tube was discussed with him on that admission, but patient declined stating he wanted to eat. He went back to the [facility], and he was readmitted to the hospital yesterday  in respiratory distress.   The patient was thought to have another episode of aspiration pneumonia. He was given nebulizers, and had significant wheezing and rhonchus breath sounds and placed on BiPAP initially. He was more stable. Chest x-ray was performed and did not show aspiration. He has had a lot of secretions in the mouth. His medical staff has been discussing the  PEG tube placment again. He is agreeable to pursuing PEG tube placement for nutrition status. He  understands that he is not going to safely eat without risk of recurrent aspiration pneumonia. The patient tells me he is feeling better, denies any abdominal complaints.   PAST MEDICAL HISTORY:  1.  CVA with residual left-sided weakness.  2.  Hyperlipidemia.  3.  Benign prostatic hypertrophy.  4.  Admission for aspiration pneumonia July 8 through 11/01/2013.  5.  Left-sided weakness with dysphagia with thickened  liquids secondary to CVA.  6.  Failure to thrive in the past.  7.  Vitamin D deficiency.  8.  History of chronic constipation.  PAST SURGICAL HISTORY:  Unknown.   HOME MEDICATIONS: See admission H and P. The patient is unable to give history.  1.  Albuterol nebulizer 3 mL q. 4 hours p.r.n. for shortness of breath.  2.  Atorvastatin 20 mg orally at bedtime.  3.  Benzonatate 200 mg capsules twice daily.  4.  Plavix 75 mg daily.  5.  Levaquin 500 mg daily for 7 days, which he finished.  6.  Remeron 15 mg 1/2 tablet once daily at bedtime.  7.  Senokot 2 tablets once daily for constipation as needed.  8.  Flomax 0.4 mg in the evening and after supper.   ALLERGIES: NO RECORDED DRUG ALLERGIES IN CHART REVIEW.   SOCIAL HISTORY: Resident of [facility]. He is bed bound. He denies tobacco or alcohol.   FAMILY HISTORY: Unknown.   REVIEW OF SYSTEMS:  Difficult to obtain.  Patient does speak his name, answered to the question of when his stroke was and said last year. His speech is difficult to understand. He says he is coughing, denies shortness of breath or chest pain.   PHYSICAL EXAMINATION:  VITAL SIGNS: 97.3, 73, 18, 111/73, 96% on 2 liters nasal cannula.  GENERAL: Very thin, African American male resting in bed in no acute distress.  HEENT: Head is normocephalic. Conjunctivae pink. Oral mucosa is moist and intact.  NECK: Supple. Trachea midline.  CARDIAC: S1, S2 without murmur or gallop.  LUNGS: Rhonchorous breath sounds anteriorly in all lung fields. The patient was able to sit up with minimal assistance, and he has posterior rhonchi as well. He has a moist cough noted throughout the exam.  ABDOMEN: Soft, flat, nontender.  EXTREMITIES: Without edema, cyanosis, or clubbing.  SKIN: Warm and dry without rash noted.  NEUROLOGY: Left facial droop and weakness noted.   LABORATORY: Admission blood work 11/19/2013, with glucose 148, B-type natriuretic peptide 883, BUN 20, creatinine 1.03.  Electrolytes unremarkable. Magnesium 1.7, albumin 3.4, alkaline phosphatase 200, ALT is 18. Troponin 0.10, elevated; repeat study 0.08, elevated; yesterday 0.04. WBC 8.8, hemoglobin 12.9.  Repeat WBC today 12.4, hemoglobin 11.5. Urine culture negative. Blood culture negative.  Admission lactic acid 1.4.   RADIOLOGY:  Chest, single view, portable:  Hyperinflation consistent with COPD, no acute cardiopulmonary abnormality, dated 11/19/2013. Repeat chest x-ray has been ordered for today.   IMPRESSION: The patient admitted with respiratory distress, suspect a repeat aspiration event. Admitting chest x-ray does not show aspiration pneumonia; however, patient's clinical course is worrisome with rhonchi and rising WBC. He is on IV Levaquin and Zosyn appropriately. We will see what repeat chest x-ray shows.  He also had slight elevation in troponin on admission, and he will need to be monitored over the next few days before committing to performing PEG tube. I did discuss the PEG tube with the patient, explained what it was and why it was necessary and he is in full agreement. This case was discussed with Dr. Mechele CollinElliott  in collaboration of care.  Further GI recommendations pending clinical course.   Thank you for this consultation.  ____________________________ Ranae PlumberKimberly A. Arvilla MarketMills, ANP (Adult Nurse Practitioner) kam:ts D: 11/20/2013 16:11:52 ET T: 11/20/2013 16:40:22 ET JOB#: [id number]  cc: Cala BradfordKimberly A. Arvilla MarketMills, ANP (Adult Nurse Practitioner), <Dictator> Ranae PlumberKimberly A. Suzette BattiestMills RN, MSN, ANP-BC Adult Nurse Practitioner ELECTRONICALLY SIGNED 11/20/2013 17:38

## 2014-08-16 NOTE — H&P (Signed)
[name] NAME:  Benjamin [name], Benjamin [name] MR#:  [id number] DATE OF BIRTH:  [birth year]  DATE OF ADMISSION:  11/19/2013  ADMITTING PHYSICIAN:  Enid Baas, MD.  PRIMARY CARE PHYSICIAN: Verl Bangs, DO.   CHIEF COMPLAINT: Difficulty breathing.   HISTORY OF PRESENT ILLNESS: Benjamin [name] is a 64 year old African American male with past medical history significant for history of CVA with residual left-sided weakness, dysphagia, aphasia, hyperlipidemia, depression, and benign prostatic hypertrophy.  He is a resident of [facility], had a recent admission about 2 weeks ago for aspiration pneumonia.  Comes back from [facility] secondary to difficulty breathing this morning, Benjamin [name] is aphasic and unable to provide any history at this time. Most of Benjamin history is obtained from nursing home records and also from ER physician. Benjamin [name] was noted to be in respiratory distress this morning and saturations were 96% on CPAP, but very tachypneic breathing at a rate of 40.  Not sure if [name] had another episode of aspiration. He was brought in, nebulizers were given. He was wheezing significantly with rhonchus breath sounds, put on BiPAP initially.  Benjamin [name] is more stable at this time after nebulizer treatments and chest x-ray did not show any acute infiltrates. He has a lot of secretions in his mouth, and he is resting on 2 liters oxygen. According to recent discharge summary, it seems like Benjamin [name] was admitted for aspiration pneumonia a few weeks ago, had a speech evaluation done and also a modified barium swallow study done, which Benjamin [name] failed, and according to Benjamin modified barium swallow study, [name] is at high risk for aspiration with any consistency of diet. So PEG tube was discussed  and [name] refused and wanted to eat at Benjamin time. He was made a DO NOT RESUSCITATE and sent back to Benjamin [facility] at Benjamin time; however, Benjamin CODE STATUS now indicated that [name] is FULL CODE. When discussed about this  being another aspiration episode, then [name] might have to consider PEG tube and [name] says that he is open to that discussion at this time and he is okay with having a PEG tube done.   PAST MEDICAL HISTORY:  1.  History of CVA with residual left-sided weakness.  2.  Hyperlipidemia.  3.  Benign prostatic hypertrophy.  4.  Recent admission for aspiration pneumonia.  5.  Dysphagia secondary to stroke.  6.  Failure to thrive in Benjamin past.  7.  Vitamin D deficiency.  8.  Chronic constipation.   PAST SURGICAL HISTORY:  Not known at this time.   ALLERGIES: NO REPORTED DRUG ALLERGIES.   CURRENT MEDICATIONS:  1.  Albuterol nebulizer 3 mL q. 4 hour p.r.n. for shortness of breath.  2.  Atorvastatin 20 mg p.o. at bedtime.  3.  Benzonatate 200 mg capsule p.o. b.i.d.  4.  Plavix 75 mg p.o. daily.  5.  Levaquin 500 mg p.o. daily for 7 days, which she finished.  6.  Remeron 15 mg 1/2 tablet once a day at bedtime.  7.  Senokot 2 tablets once a day for constipation as needed.  8.  Flomax 0.4 mg p.o. in Benjamin evening after supper.   SOCIAL HISTORY: Benjamin [name] is a resident of [facility], mostly bed bound.  No current smoking or alcohol use.   FAMILY HISTORY: Not known.    REVIEW OF SYSTEMS: Difficult to be obtained secondary to Benjamin [name]'s aphasia.   PHYSICAL EXAMINATION:  VITAL SIGNS: Temperature 99.6 degrees Fahrenheit, pulse 134, respirations  41, blood pressure 141/89, pulse oximetry 96% on 100% FiO2 on CPAP.   GENERAL: Well-built, well-nourished male, sitting in bed, not in any acute distress at this time.  HEENT: Normocephalic, atraumatic. Pupils equal, round, reacting to light. Anicteric sclerae. Extraocular movements intact.  Oropharynx with increased elevation, poor dentition, no masses,  erythema, or exudates.  NECK: Supple. No thyromegaly, JVD, or carotid bruits. No lymphadenopathy.  LUNGS: Benjamin [name] is moving, air has severe rhonchorous breath sounds posteriorly at Benjamin base.  No wheeze. No crackles. No use of accessory muscles for breathing.  CARDIOVASCULAR: S1 and S2, rapid rate and regular rhythm. No murmurs, rubs, or gallops.  ABDOMEN: Soft, nontender, nondistended. No hepatosplenomegaly. Normal bowel sounds.  EXTREMITIES: No pedal edema. No clubbing or cyanosis.  Two plus dorsalis pedis pulses palpable bilaterally.  SKIN: No acne, rash, or lesions.  LYMPHATICS: No cervical or axillary lymphadenopathy.  NEUROLOGIC: Left-sided weakness present. Benjamin [name] is aphasic. No new neurological deficits.  PSYCHOLOGICAL: Benjamin [name] seems alert and oriented.   LABORATORY DATA: WBC 8.8, hemoglobin 12.9, hematocrit 40.1, platelet count 245,000.   Sodium 139, potassium 3.9, chloride 104, bicarbonate 26, BUN 20, creatinine 1.03, glucose 148, and calcium of 9.6.   ALT 18, AST 21, alkaline phosphatase 200, total bilirubin 0.7, albumin of 3.4. Lipase 136, magnesium 1.7, INR 1.1. Troponin I 0.10, phosphorus 2.7. BNP is 883 ABG showing 7.42 pH, pCO2 of 41, pO2 357, bicarbonate 26.6 and saturations of 100% on 100% FiO2,   Chest x-ray revealing hyperinflation consistent with COPD.  No acute cardiopulmonary abnormality noted.   Urinalysis with 2+ leukocyte esterase, 286 WBC and no bacteria seen.    EKG showing sinus tachycardia with Benjamin right bundle branch block. Heart rate of 128, no acute ST-T wave changes.   ASSESSMENT AND PLAN: This is a 64 year old male with a past medical history of cerebrovascular accident with aphasia, dysphagia, residual left-sided weakness, recent admission for aspiration pneumonia, [facility], hypoxia, and possible aspiration.   1.  Acute respiratory failure requiring BiPAP, currently stable on 2 liters O2. Could be another aspiration event and some reactive airway disease/chronic obstructive pulmonary disease exacerbation. Recent modified barium swallow done and PEG tube was refused at Benjamin time; however,  Benjamin [name] is okay for PEG tube if needed, but we will get palliative care consult again. Placed on Levaquin and Zosyn, steroids and nebulizer treatments. Follow-up blood cultures. No other infiltrate noted on chest x-ray for now.   2.  Cerebrovascular accident with bedbound status, left-sided weakness, dysphagia, and aphasia.  [name] on Plavix and statin; however, they are held while he is n.p.o.   3.  Benign prostatic hypertrophy.  Again on Flomax at home, which was held due to n.p.o. status.   4.  Urinary tract infection. Follow up urine cultures. Discontinue Benjamin Foley, which was placed in Benjamin ER.  Benjamin [name] wears diapers at SNF.  Urine cultures have been sent. Benjamin [name] already on antibiotics.   5.  Deep vein thrombosis prophylaxis with subcutaneous heparin.   6.  CODE STATUS: FULL CODE, according to [facility] papers; however, recent discharge 2 weeks ago states that he is a DO NOT RESUSCITATE. No DO NOT RESUSCITATE form seen, so will need to be addressed by palliative care team again.  TOTAL TIME SPENT ON ADMISSION: 50 minutes.   ____________________________ Enid Baasadhika Labrittany Wechter, MD rk:ts D: 11/19/2013 11:44:02 ET T: 11/19/2013 12:01:03 ET JOB#: [id number]  cc: Enid Baasadhika Veeda Virgo, MD, <Dictator>  [facility] Enid Baas MD ELECTRONICALLY SIGNED 11/20/2013 15:10

## 2014-08-16 NOTE — Consult Note (Signed)
PATIENT NAME:  Benjamin [name], Benjamin [name] MR#:  [id number] DATE OF BIRTH:  [birth year]  DATE OF CONSULTATION:  12/16/2013  REFERRING PHYSICIAN:    CONSULTING PHYSICIAN:  Stann Mainland. Sampson Goon, MD  REQUESTING PHYSICIAN: Patricia Pesa, MD   REASON FOR CONSULT:  Bacteremia and pneumonia.   HISTORY OF PRESENT ILLNESS: This is a 64 year old gentleman with history of prior CVA as well as left-sided hemiparesis with chronic dysphagia, and aphasia. He was admitted August 19th, with hypoxia noted at his nursing home. He was brought to the Emergency Room where he was placed on BiPAP. He had a CT of his chest to rule out a PE, and was found to have left lower lobe pneumonia; the patient since been admitted, and treated with vancomycin, and Zosyn. Blood cultures 1 of 2 are growing staph hominis.   Since admission, the patient has stabilized. He has remained hemodynamically stable. He does still have a thick cough. He has been afebrile.   PAST MEDICAL HISTORY: 1.  CVA with residual dysphagia, aphasia, and probable aspiration pneumonia.  2.  Severe dysphagia status post PEG tube placement with the recommendation that the patient be strict n.p.o.  3.  Hyperlipidemia.  4.  BPH.  5.  Vitamin D deficiency.  6.  Chronic constipation.  7.  Failure to thrive.  8.  Recurrent aspiration pneumonia.   PAST SURGICAL HISTORY: PEG tube placement in July of 2015.   SOCIAL HISTORY: The patient is a resident of [facility]; nonsmoker; no alcohol or drugs.   FAMILY HISTORY: Noncontributory.   REVIEW OF SYSTEMS: Unable to be obtained.   ANTIBIOTICS SINCE ADMISSION:  Include vancomycin as well as Zosyn, and azithromycin.   PHYSICAL EXAMINATION: VITAL SIGNS:  Temperature 97.4, pulse 80, blood pressure 110/70, respirations 18, sat 92% on room air.  GENERAL: He is disheveled and chronically ill-appearing.  HEENT: Pupils equal, round, reactive to light and accommodation.  OROPHARYNX: He has got thick secretions that he is a  Yankauer suction to suction out. He has got no thrush.  NECK: Supple.  HEART: Regular.  LUNGS: He has coarse upper respiratory sounds with mucous, but no wheeze.  ABDOMEN: Soft, nontender, he has a PEG tube in place.  EXTREMITIES: No clubbing, cyanosis, or edema.  NEUROLOGIC: He is awake and interactive, but has aphasia.   DATA: Blood cultures 1 of 2, is growing staph hominis. White blood count on admission was 7.4, hemoglobin 11.4, platelets 223,000; urine culture negative, urinalysis negative, renal function is normal. CT of his chest showed improved aeration of the right lung, increasing groundglass nodules and left lower lobe and left lower lobe consolidation.   IMPRESSION: A 64 year old with prior CVA with hemiparesis as well as chronic dysphagia, and aspiration pneumonia now status post PEG tube placement. He was admitted with respiratory distress. Clinically, he is improved, and is off of oxygen. He has a lot of upper airway rhonchi, and secretions. Blood cultures 1 of 2, are positive for staph hominis.   RECOMMENDATIONS:  1.  The staph hominis is most likely contaminant as it is in 1 of 2 cultures. He has not had a fever, and did not have a white count, so there is no evidence of bacteremia.  2.  He almost certainly has recurrent aspiration. At this point, I would recommend stopping the vancomycin, Zosyn, and azithromycin. I would recommend covering him for another 5 days with Augmentin for a full 10 day course.   Thank you for the consult. I will be glad  to follow with you.    ____________________________ Stann Mainlandavid P. Sampson GoonFitzgerald, MD dpf:nt D: 12/16/2013 15:03:01 ET T: 12/16/2013 16:47:11 ET JOB#: [id number]  cc: Stann Mainlandavid P. Sampson GoonFitzgerald, MD, <Dictator> Zaia Carre Sampson GoonFITZGERALD MD ELECTRONICALLY SIGNED 12/22/2013 22:24

## 2014-08-16 NOTE — Discharge Summary (Signed)
PATIENT NAME:  Benjamin DukeMCCOMB, GARY MR#:  [id number] DATE OF BIRTH:  18-Feb-1951  DATE OF ADMISSION:  12/11/2013 DATE OF DISCHARGE:  12/17/2013  DISCHARGE DIAGNOSES:  1.  Acute respiratory failure due to recurrent aspiration pneumonia, likely due to his high secretions in the back of the throat. 2.  Staphylococcus hominis growing in the blood culture, likely contaminant.   SECONDARY DIAGNOSES:  1.  History of hemiplegia on the left side.  2.  Dysphagia.  3.  Aphasia. 4.  Hyperlipidemia.  5.  Depression.  6.  Benign prostatic hypertrophy.   CONSULTATIONS: Infectious disease, Dr. Clydie Braunavid Fitzgerald.   PROCEDURES AND RADIOLOGY: 1.  Chest x-ray on the 23rd of August showed no acute cardiopulmonary disease.  2.  Chest x-ray on 19th of August showed no acute cardiopulmonary disease.  3.  CT scan of the chest on 19th of August showed no acute pulmonary process, possible pneumonia at the bases on the left lower lobe. Centrilobular emphysema.   MAJOR LABORATORY PANEL: 1.  UA on admission was negative.  2.  Blood culture, one of the bottles grew Staphylococcus hominis. The other 2 bottles were negative. Urine culture remained negative.   HISTORY AND SHORT HOSPITAL COURSE: The patient is a 64 year old male with above-mentioned medical problems who was admitted for acute on chronic respiratory failure with hypoxemia thought to be due to aspiration pneumonia. Please see Dr. Casimiro NeedleMichael Diamond's dictated history and physical for further details. The patient was slowly improving on broad-spectrum IV antibiotic. The patient's blood culture, one out of two, was growing Staphylococcus hominis, for which infectious disease consultation was obtained with Dr. Clydie Braunavid Fitzgerald, who felt this to be a contaminant and not a real infection, as patient did not have any fever or leukocytosis. Patient was switched over to oral Augmentin to finish a total 10-day course of antibiotic. The patient was feeling close to his baseline and  was discharged back to his facility on the 25th of August.  VITAL SIGNS: On the date of discharge, his vital signs were as follows: Temperature 98.9, heart rate 81 per minute, respirations 17 per minute, blood pressure 116/70 mmHg. He was saturating 96% on room air.   PERTINENT PHYSICAL EXAMINATION ON THE DATE OF DISCHARGE:  CARDIOVASCULAR: S1, S2 normal. No murmurs, rubs, or gallops.  LUNGS: Decreased breath sounds at the bases.  ABDOMEN: Soft, benign. He has a PEG tube in place without any signs of infection around.  NEUROLOGIC: He has a residual left-sided palsy, aphasic, difficulty participating with neurologic exam. He does have facial droop as well as some tongue deviation on the right.   All other physical examination remained at baseline.   DISCHARGE MEDICATIONS:  1.  Senna 2 tablets p.o. daily as needed.  2.  Atorvastatin 20 mg p.o. at bedtime.  3.  Free water 250 mL via G-tube twice a day.  4.  Pepcid 40 mg p.o. b.i.d.  5.  Terazosin 2 mg p.o. at bedtime via G-tube.  6.  DuoNeb inhalation 4 times a day.  7.  Scopolamine topical patch every 3 days.  8.  Augmentin 875 mg p.o. b.i.d. via G-tube for 5 days.   DISCHARGE DIET: Via tube feeds as before.   DISCHARGE ACTIVITY: As tolerated.  DISCHARGE INSTRUCTIONS AND FOLLOWUP: The patient was instructed to follow up with [facility] in 1-2 days. He will need followup with Dr. Clydie Braunavid Fitzgerald from infectious disease in 2-3 weeks. He will get physical therapy eval and management  while at the facility, to follow at the facility.  TIME SPENT: Total time discharging this patient was 45 minutes.  Please note, patient remains at very high risk for recurrent aspiration and recurrent hospitalization.   ____________________________ Ellamae Sia. Sherryll Burger, MD vss:ST D: 12/17/2013 16:54:28 ET T: 12/17/2013 23:42:39 ET JOB#: [id number]  cc: Leevi Cullars S. Sherryll Burger, MD, <Dictator> [facility]. Suanne Marker, DO Stann Mainland. Sampson Goon,  MD  Ellamae Sia [facility] MD ELECTRONICALLY SIGNED 12/19/2013 10:58

## 2014-08-16 NOTE — H&P (Signed)
PATIENT NAME:  Benjamin [name], Benjamin [name] MR#:  [id number] DATE OF BIRTH:  10/21/1950  DATE OF ADMISSION:  12/11/2013  REFERRING PHYSICIAN:  Valli Glance. Owens Shark, MD  PRIMARY CARE DOCTOR: Glennie Hawk, D.O.    ADMITTING DIAGNOSES:  1.  Acute on chronic respiratory failure with hypoxemia.  2.  Pneumonia.   HISTORY OF PRESENT ILLNESS: This is a 64 year old African American male with a left-sided hemiparesis following a stroke, who was found to be hypoxemic at his nursing home to approximately 60% on room air. The patient was still saturating in the 80s on 6 L of oxygen when he was brought in to the Emergency Department, where he was promptly placed on BiPAP. The patient received a breathing treatment and was subsequently found to have a left lower lobe pneumonia on CT angiography of the chest. He does not have pulmonary embolism. The patient met sepsis criteria, and the ED staff obtained blood cultures and ordered vancomycin and Zosyn. The hospitalist staff was contacted for admission for further treatment.   REVIEW OF SYSTEMS: The patient answers yes and no to discrete questions and denies fever or pain, but cannot specifically endorse with reliability a complete review of systems. He seems to deny feeling short of breath at this time, but he admits to having a cough for a number of weeks.   PAST MEDICAL HISTORY: Left-sided hemiplegia, dysphagia, aphasia, hyperlipidemia, depression and benign prostatic hypertrophy.   PAST SURGICAL HISTORY: Not available at this time.   SOCIAL HISTORY: The patient is a resident of [facility]. He is a nonsmoker. He denies alcohol or drug use.   FAMILY HISTORY: Not known.    CURRENT MEDICATIONS:  1.  Terazosin  2 mg capsule, 1 capsule per tube at bedtime.  2.  Senna 50 mg/8.6 mg tablet, 2 tablets per tube as needed for constipation.  3.  Pepcid 40 mg 2 tablets per tube daily.  4.  Free water 250 mL via PEG tube 2 times a day.  5.  DuoNebs 2.5 mg/0.5 mg per 3 mL nebulized  solution, 3 mL inhaled 4 times a day.  6.  Atorvastatin 20 mg 1 tablet per tube q. h.s.   ALLERGIES: No known drug allergies.   PERTINENT LABORATORY RESULTS AND RADIOGRAPHIC FINDINGS: Serum glucose 130, albumin 3.0, alkaline phosphatase 156, AST 29, ALT 23, troponin less than 0.02. White blood cell count 7.4, hemoglobin 11.4, hematocrit 34.6. INR is 1. Urine is negative for infection. Arterial blood gas on BiPAP shows a pH of 7.37, pCO2 of 51, pO2 of 120 with a base excess of 3.1 on 60% FiO2. Calculated bicarbonate is 29.5. Lactic acid is 1.8.   CT angiogram of the chest, results as above. Chest x-ray shows no acute cardiopulmonary findings.    PHYSICAL EXAMINATION:  VITAL SIGNS: Temperature 99 rectally, pulse 96, respirations 24, blood pressure 113/69, pulse oximetry 98% on 6 L of oxygen via nasal cannula.   GENERAL: The patient is alert and oriented to person and place. He is in no apparent distress.  HEENT: Normocephalic, atraumatic. Pupils equal, round, and reactive to light and accommodation. Extraocular movements are intact. Teeth definitely need cleaning, but there is no erythema or exudate in the oropharynx.  NECK: Trachea is midline. No adenopathy.  CHEST: Symmetric and atraumatic.  CARDIOVASCULAR: Tachycardic rate, normal S1 and S2. No rubs, clicks or murmurs.  LUNGS: Diminished breath sounds in the bases bilaterally with some rhonchi in the right middle lobes. The patient is not retracting, but is tachypneic.  ABDOMEN: Positive bowel sounds. Soft, nontender, nondistended. PEG tube in place without drainage or erythema.  GENITOURINARY: The patient is wearing a diaper.  MUSCULOSKELETAL: The patient moves his right side very well, but seems to have weakness of his left upper and lower extremity. It is difficult for him to cooperate fully with physical examination.   SKIN: Does not appear to have any areas of breakdown. I could not roll the patient completely, but he does not have any  visible rashes or wounds.  EXTREMITIES: No clubbing, cyanosis or edema.  NEUROLOGIC: The patient has a right-sided facial droop as well as well as some tongue deviation to the right. He is having difficulty participating with rapid alternating movements.  PSYCHIATRIC: The patient appears to have a normal mood.  Affect is difficult to define.   ASSESSMENT AND PLAN: This is a 64 year old male with left lower lobe pneumonia, likely secondary to aspiration given his dysphagia secondary to old cerebrovascular accident.  1.  Pneumonia. The patient's respiratory status is lot better now on nasal cannula. He has received vancomycin and Zosyn which we will continue. Add azithromycin to treat healthcare associated pneumonia.  We have been able to wean his oxygen to 4 L by nasal cannula at the time of this dictation, and we have achieved oxygen saturations of 94 to 96%. He is on 2 L of oxygen continuously at home. This will be our goal for admission. He is not febrile and he does not have leukocytosis at this time. Blood cultures are pending.  2.  Cerebrovascular accident. The patient has all his feeds via PEG tube. He was able to inform me that he does not eat anything by mouth. However, this obviously does not prevent him from aspirating his saliva. We will continue the patient's PEG tube feeds. He was at one point on Plavix; I do not see any anti-platelet medication on his regimen right now. We will establish if this is perhaps an oversight, and resume this medication if needed. Continue statin for potential plaque stabilization.  3.  Benign prosthetic hypertrophy. Continue terazosin   4.  Deep vein thrombosis prophylaxis, SCDs  5.  Gastrointestinal prophylaxis, and famotidine via PEG tube.   CODE STATUS: The patient is a DNR.   TIME SPENT ON ADMISSION ORDERS AND PATIENT CARE: Approximately 30 minutes.    ____________________________ Norva Riffle. Marcille Blanco, MD msd:MT D: 12/11/2013 05:23:43 ET T: 12/11/2013  05:39:22 ET JOB#: [id number]  cc: Norva Riffle. Marcille Blanco, MD, <Dictator> Norva Riffle Zuma Hust MD ELECTRONICALLY SIGNED 12/11/2013 8:16

## 2014-08-16 NOTE — Discharge Summary (Signed)
PATIENT NAME:  Benjamin [name], Benjamin [name] MR#:  [id number] DATE OF BIRTH:  05/22/1950  DATE OF ADMISSION:  10/30/2013 DATE OF DISCHARGE:  11/01/2013  REASON FOR ADMISSION: Acute hypoxic respiratory failure/shortness of breath.   DISCHARGE DIAGNOSES: 1.  Aspiration with aspiration pneumonitis.  2.  Severe dysphagia.  3.  Systemic inflammatory response syndrome with evidence of tachycardia and tachypnea on admission with oxygen saturation at 86%.  4.  History of cerebrovascular accident.  5.  Hyperlipidemia.   DISPOSITION: Skilled nursing facility.  RECOMMENDATIONS: speech therapy to work on swallowing issues. Aspiration precautions, strict. As a side note, the patient has undergone several studies over here. With consultation with speech therapy, has determined that the patient has continuous aspiration with all consistencies. The patient was offered to have a PEG tube. He refused it. He wants to eat instead. The patient has been educated about the dangers and complications of his decision. The patient is aware of it. The patient understands that he is going to continue to aspirate and that he could have a significant amount of distress and possible even death. He still wants to continue to do what he is doing, eating. We are going to put him on the safest diet with significant strict aspiration precautions.   DISCHARGE MEDICATIONS: Flomax 0.4 mg at bedtime pain, mirtazapine 50 mg 1/2 tablet at bedtime, atorvastatin 20 mg at bedtime, Plavix 75 mg daily, senna as needed for constipation.   HOSPITAL COURSE: This is a very nice 64 year old gentleman who has history of a recent stroke back in May. The patient was [facility]. He has residual left side weakness and significant dysphagia for what he has been on a pureed diet with thickened liquids. The patient had acute shortness of breath and after he started eating he started coughing significantly and choking. He became very short of breath. EMS  was called. His oxygen saturation was 80%, improved after supplemental oxygen. The patient was admitted through the Emergency Department where he was tachycardic, tachypneic with oxygen saturation in the mid 80s, improved after supplemental oxygen. The patient did not have any signs of infection, no fever. He did have systemic inflammatory response syndrome, which was likely due to the aspiration with aspiration pneumonitis. The patient did not require antibiotics. He was monitored. There were no significant changes in his status, no fever, no signs of infection through the hospitalization for what he was discharged without antibiotics.   As far as the aspiration goes, the patient has severe dysphagia. As mentioned above, he had a speech therapy evaluation and modified swallow evaluation and determined that he aspirates at all consistencies. The patient has high risk of aspiration and as mentioned above he wants to take the risk and refused to have a PEG tube. At this moment, the patient is stable and his back on room air.   As far as his CVA, it happened back in March. The patient has left side weakness. Physical therapy and occupational therapy to continue intensively as possible. Speech therapy to continue intensive as possible. He does not have significant elevation of his blood pressure and he is taking Plavix for stroke prophylaxis.   I had a long conversation with the patient and the sister about his condition. They understand what is going on. All questions answered.   TIME SPENT: About 45 minutes with this discharge today.   ____________________________ Felipa Furnaceoberto Sanchez Gutierrez, MD rsg:sb D: 11/01/2013 10:29:45 ET T: 11/01/2013 10:57:57 ET JOB#: [id number]  cc: Felipa Furnaceoberto Sanchez Gutierrez,  MD, <Dictator> Pearletha Furl MD ELECTRONICALLY SIGNED 11/07/2013 23:06

## 2014-08-16 NOTE — Consult Note (Signed)
EGD done by me and PEG done by Dr. Bluford Kaufmannh.  start tube feedings tomorrow at 4pm.  May use it at 8pm tonight for water and crushed meds.  Electronic Signatures: Scot JunElliott, Jan Walters T (MD)  (Signed on 30-Jul-15 15:43)  Authored  Last Updated: 30-Jul-15 15:43 by Scot JunElliott, Joshau Code T (MD)

## 2014-08-16 NOTE — Consult Note (Signed)
Pt denies any abd pain.  Chest clear on my exam.  abd flat.  Can start tube feeding now with bolus feedings as per dietician. Could go back to nursing home tomorrow.  Electronic Signatures: Scot JunElliott, Teofila Bowery T (MD)  (Signed on 31-Jul-15 16:41)  Authored  Last Updated: 31-Jul-15 16:41 by Scot JunElliott, Anglea Gordner T (MD)

## 2014-09-29 ENCOUNTER — Encounter: Payer: Self-pay | Admitting: Neurology

## 2014-09-29 ENCOUNTER — Ambulatory Visit (INDEPENDENT_AMBULATORY_CARE_PROVIDER_SITE_OTHER): Payer: Medicare Other | Admitting: Neurology

## 2014-09-29 VITALS — BP 103/64 | HR 95

## 2014-09-29 DIAGNOSIS — G1229 Other motor neuron disease: Secondary | ICD-10-CM

## 2014-09-29 NOTE — Progress Notes (Signed)
PATIENT: Benjamin [name] DOB: 1950-10-04  REASON FOR VISIT: hospital follow up for stroke HISTORY FROM: patient  HISTORY OF PRESENT ILLNESS: Benjamin [name] is a 64 y.o. male who comes for her first hospital visit post hospital discharge for stroke. He is accompanied today with his sister. She reports that he had strokes in December 2010, March 2013, and October 2014 previously. The stroke in October of last year affected his left side and his speech. He was ambulating with a walker. He noted that when he awakened the morning of 07/18/2013 he was unable to get to the bathroom with his walker like he usually does and felt that he was weaker on the left. He presented at that time for evaluation. Patient was on an aspirin a day at home for stroke prophylaxis. Benjamin [name] showed acute on chronic small vessel ischemia in the right corona radiata and right caudate nucleus. Stable and negative intracranial MRA except for relatively mild large vessel atherosclerosis.  2D Echocardiogram ejection fraction 55-60%. No cardiac source of emboli identified. Carotid Doppler showed no significant stenosis. Skilled nursing facility placement was recommended and he was discharged to [facility] in [place]. He's been on a dysphagia 3 diet with thickened liquids. The first weeks after hospital discharge he received physical and occupational therapies but after the visits ran out he has regressed and is mostly chair bound. He was able to walk with a walker and assistance previously but sister thinks he's not able to do that now. He has a very minimal med list, not even needing hypertensive medication. His blood pressure in office today is 115/78. He is a former smoker but hasn't smoked since 2014. He is tolerating Plavix well without significant bruising or bleeding. Update 09/29/2014 : He returns for follow-up accompanied by his sister following his last visit a year ago. He has been admitted twice since that visit  to [facility] with bouts of pneumonia. I have reviewed the records of those admissions. He has subsequently had a PEG tube inserted now. He is not walking in fact his been refusing speech physical occupational therapy. He cannot bear weight and he needs a lift to move them out. He is incontinent and wearing diaper. He requires 24-hour care. He is not had any new recurrent strokes or TIAs. He remains on Plavix for stroke prevention. Sister feels blood pressure has been under good control. It is unclear when his last lipids were checked. He has no new complaints today. REVIEW OF SYSTEMS: Full 14 system review of systems performed and notable only for: Weight loss, trouble swallowing, moles, cough, incontinence,    weakness, slurred speech, difficulty swallowing, change in appetite, disinterest in activities. Gait difficulty. And all other systems negative  ALLERGIES: No Known Allergies  HOME MEDICATIONS: Outpatient Prescriptions Prior to Visit  Medication Sig Dispense Refill  . atorvastatin (LIPITOR) 20 MG tablet Take 1 tablet (20 mg total) by mouth daily at 6 PM. 30 tablet 1  . clopidogrel (PLAVIX) 75 MG tablet Take 1 tablet (75 mg total) by mouth daily with breakfast. 30 tablet 1  . senna-docusate (SENOKOT-S) 8.6-50 MG per tablet Take 1 tablet by mouth at bedtime as needed for mild constipation. 30 tablet 0  . mirtazapine (REMERON) 15 MG tablet Take 15 mg by mouth at bedtime. Taking 1/2 tablet at bedtime.    . tamsulosin (FLOMAX) 0.4 MG CAPS capsule Take 1 capsule (0.4 mg total) by mouth daily after supper. (Patient not  taking: Reported on 09/29/2014) 30 capsule 1   No facility-administered medications prior to visit.    PHYSICAL EXAM Filed Vitals:   09/29/14 0824  BP: 103/64  Pulse: 95   There is no weight on file to calculate BMI. No exam data present  Generalized: Frail elderly AA male, no acute distress. Head: normocephalic and atraumatic.    Neck: Supple, no  carotid bruits  Cardiac:  , Regular rhythm, no murmur  Musculoskeletal: No deformity   Neurological examination  Mentation: Alert oriented to time, place,  pseudobulbar and   Dysarthric speech. Follows commands well. Cranial nerve II-XII: Fundoscopic exam not done. Pupils were equal round reactive to light extraocular movements were full, visual field were full on confrontational test.  Left facial droop, facial light touch sensation normal bilaterally, hearing was intact to finger rubbing bilaterally.  Head turning and shoulder shrug and were normal and symmetric.Tongue deviates to the left. Motor: The motor testing reveals 5/5 strength on right and 4/5 strength on left. With left hemiparesis Increased motor tone is noted on the left.  Sensory: Sensory testing is intact to soft touch on all 4 extremities.    Coordination: Cerebellar testing reveals good finger-nose-finger bilaterally.  Gait and station: Gait is not tested, in wheelchair Reflexes: Deep tendon reflexes are brisker on the left. Left plantar upgoing.    ASSESSMENT: Benjamin [name] is a 64 y.o. AA male with PMH of strokes in Dec. 2010, March 2013, Oct. 2014 who presented with increased weakness on the left in March 2015. 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  Patient with resultant pseudobulbar state with left hemiparesis, dysarthria and dysphagia.  In SNF with likely long-term care  PLAN:  I had a long d/w patient and sister about his prior multiple strokes and residual disability and pseudobulbar state, risk for recurrent stroke/TIAs, personally independently reviewed imaging studies and stroke evaluation results and answered questions.Continue Plavix  for secondary stroke prevention and maintain strict control of hypertension with blood pressure goal below 130/90, diabetes with hemoglobin A1c goal below 6.5% and lipids with LDL cholesterol goal below 70 mg/dL.  I also advised the patient to  participate with speech, physical and occupational therapy in order to be less dependent though unfortunately he is significantly disabled from his multiple strokes and unlikely to be able to live independently and will need 24-hour care for the rest of his life. Followup in the future with me only as needed. Delia HeadyPramod Korry Dalgleish, MD   09/29/2014, 9:32 AM [facility] [address], [address] [place], [place] [address] [phone]  Note: This document was prepared with digital dictation and possible smart phrase technology. Any transcriptional errors that result from this process are unintentional.

## 2014-09-29 NOTE — Patient Instructions (Signed)
  I had a long d/w patient and sister about his prior multiple strokes and residual disability and pseudobulbar state, risk for recurrent stroke/TIAs, personally independently reviewed imaging studies and stroke evaluation results and answered questions.Continue Plavix  for secondary stroke prevention and maintain strict control of hypertension with blood pressure goal below 130/90, diabetes with hemoglobin A1c goal below 6.5% and lipids with LDL cholesterol goal below 70 mg/dL. I also advised the patient to  participate with speech, physical and occupational therapy in order to be less dependent though unfortunately he is significantly disabled from his multiple strokes and unlikely to be able to live independently and will need 24-hour care for the rest of his life. Followup in the future with me only as needed.

## 2015-10-31 IMAGING — CR DG CHEST 1V PORT
1 series · 1 of 1 positions shown · non-contrast
Comparison: CT scan of the chest October 29, 2013.

CLINICAL DATA: CHF and shortness of breath

EXAM:
PORTABLE CHEST - 1 VIEW

[ap]
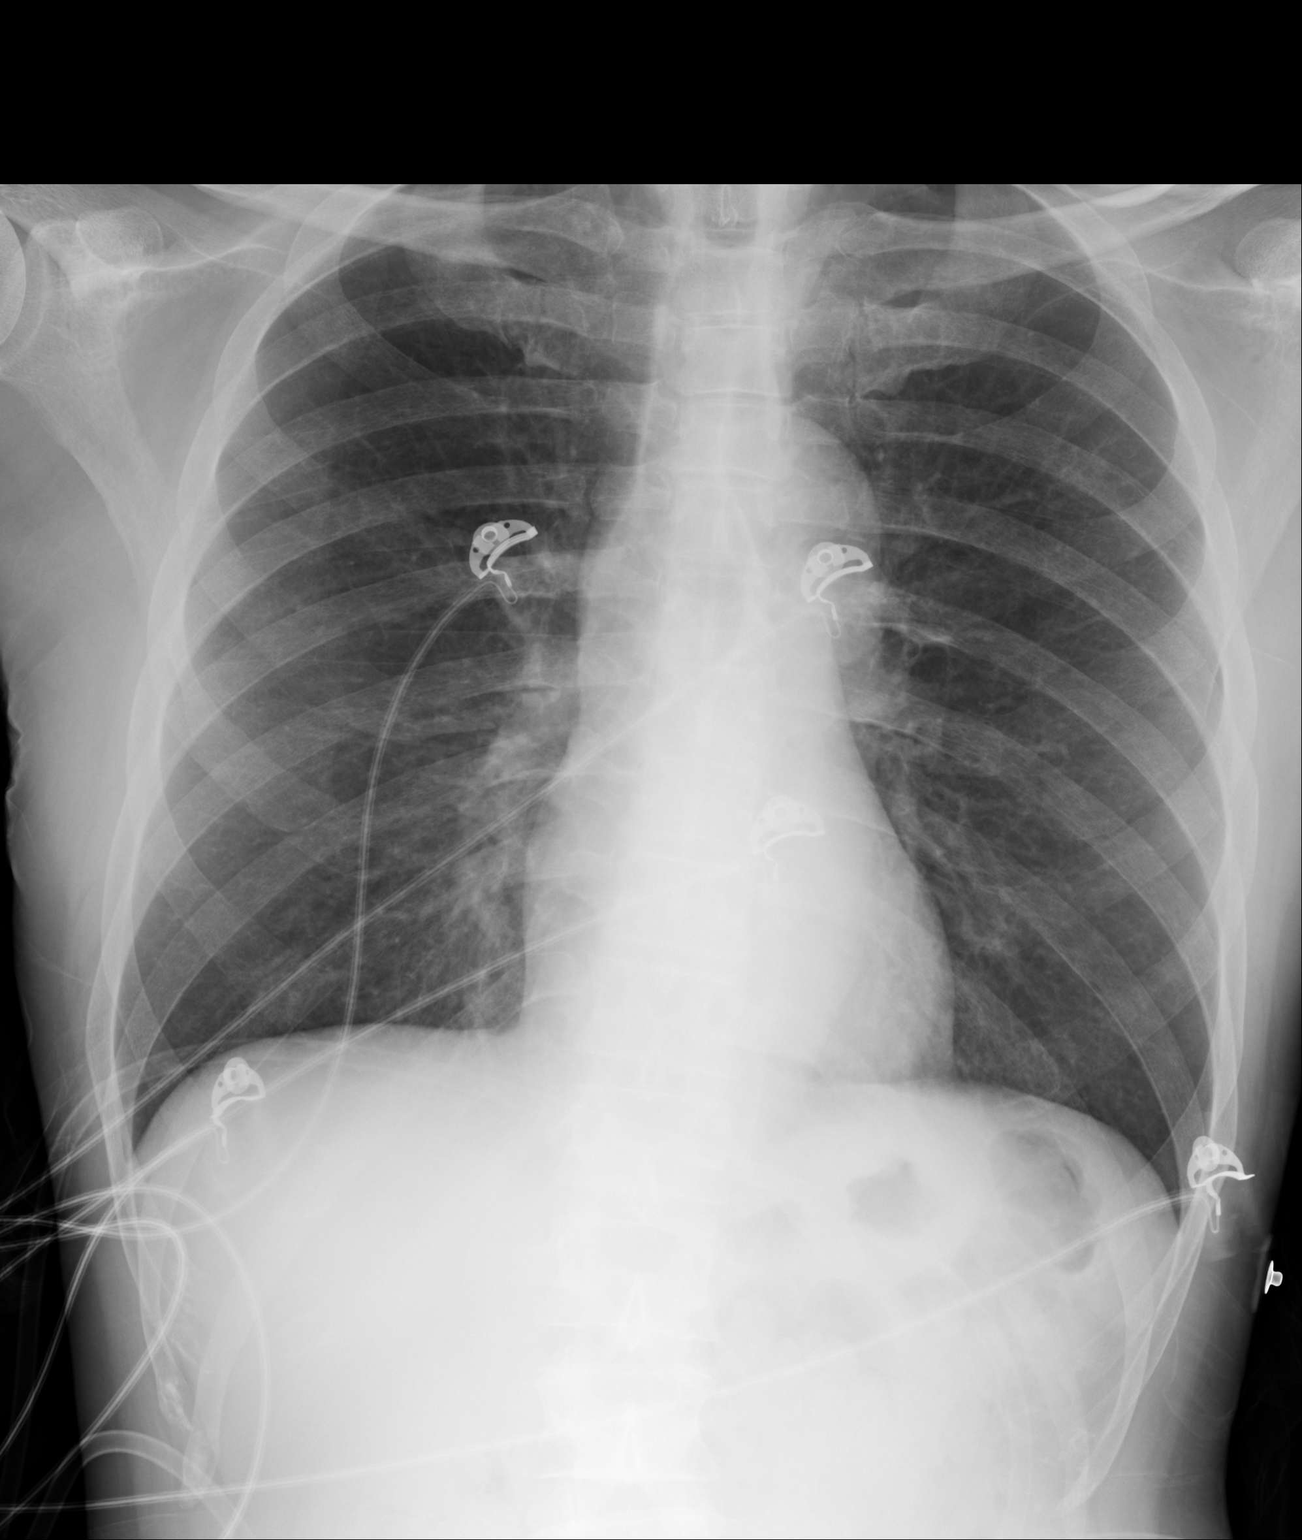

[1 of 1 positions shown; findings below may reference images not displayed]

FINDINGS: The lungs are mildly hyperinflated and clear. The heart and
mediastinal structures are unremarkable. There is no pleural
effusion or pneumothorax. The bony thorax exhibits no acute
abnormalities.
IMPRESSION: Hyperinflation consistent with COPD. There is no acute
cardiopulmonary abnormality.

## 2015-11-01 IMAGING — CR DG CHEST 1V PORT
1 series · 1 of 1 positions shown · non-contrast
Comparison: 11/11/2013

CLINICAL DATA: Cough, rhonchi

EXAM:
PORTABLE CHEST - 1 VIEW

[ap]
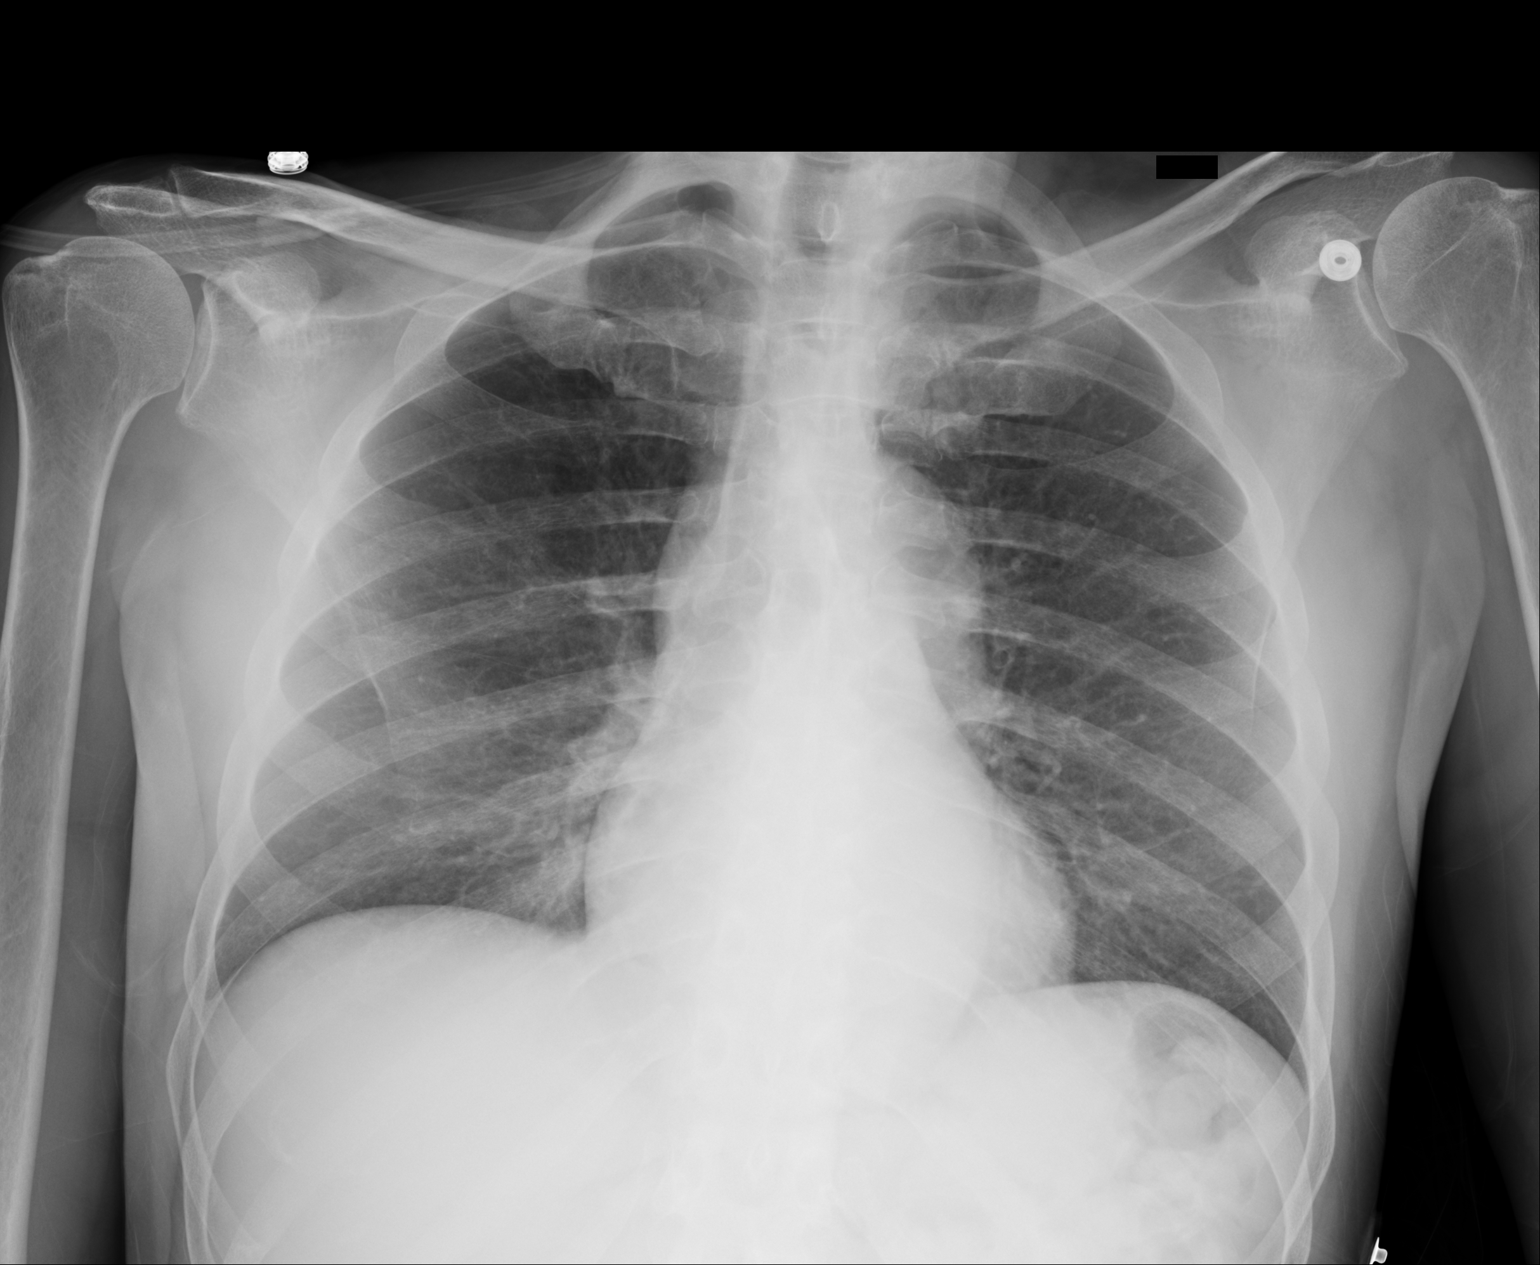

[1 of 1 positions shown; findings below may reference images not displayed]

FINDINGS: Cardiomediastinal silhouette is stable. No acute infiltrate or
pleural effusion. No pulmonary edema. Bony thorax is unremarkable.
Mild hyperinflation.
IMPRESSION: No active disease.

## 2015-11-15 IMAGING — CR DG CHEST 1V PORT
1 series · 1 of 1 positions shown · non-contrast
Comparison: Chest radiograph November 20, 2013

CLINICAL DATA: Shortness of breath.

EXAM:
PORTABLE CHEST - 1 VIEW

[ap]
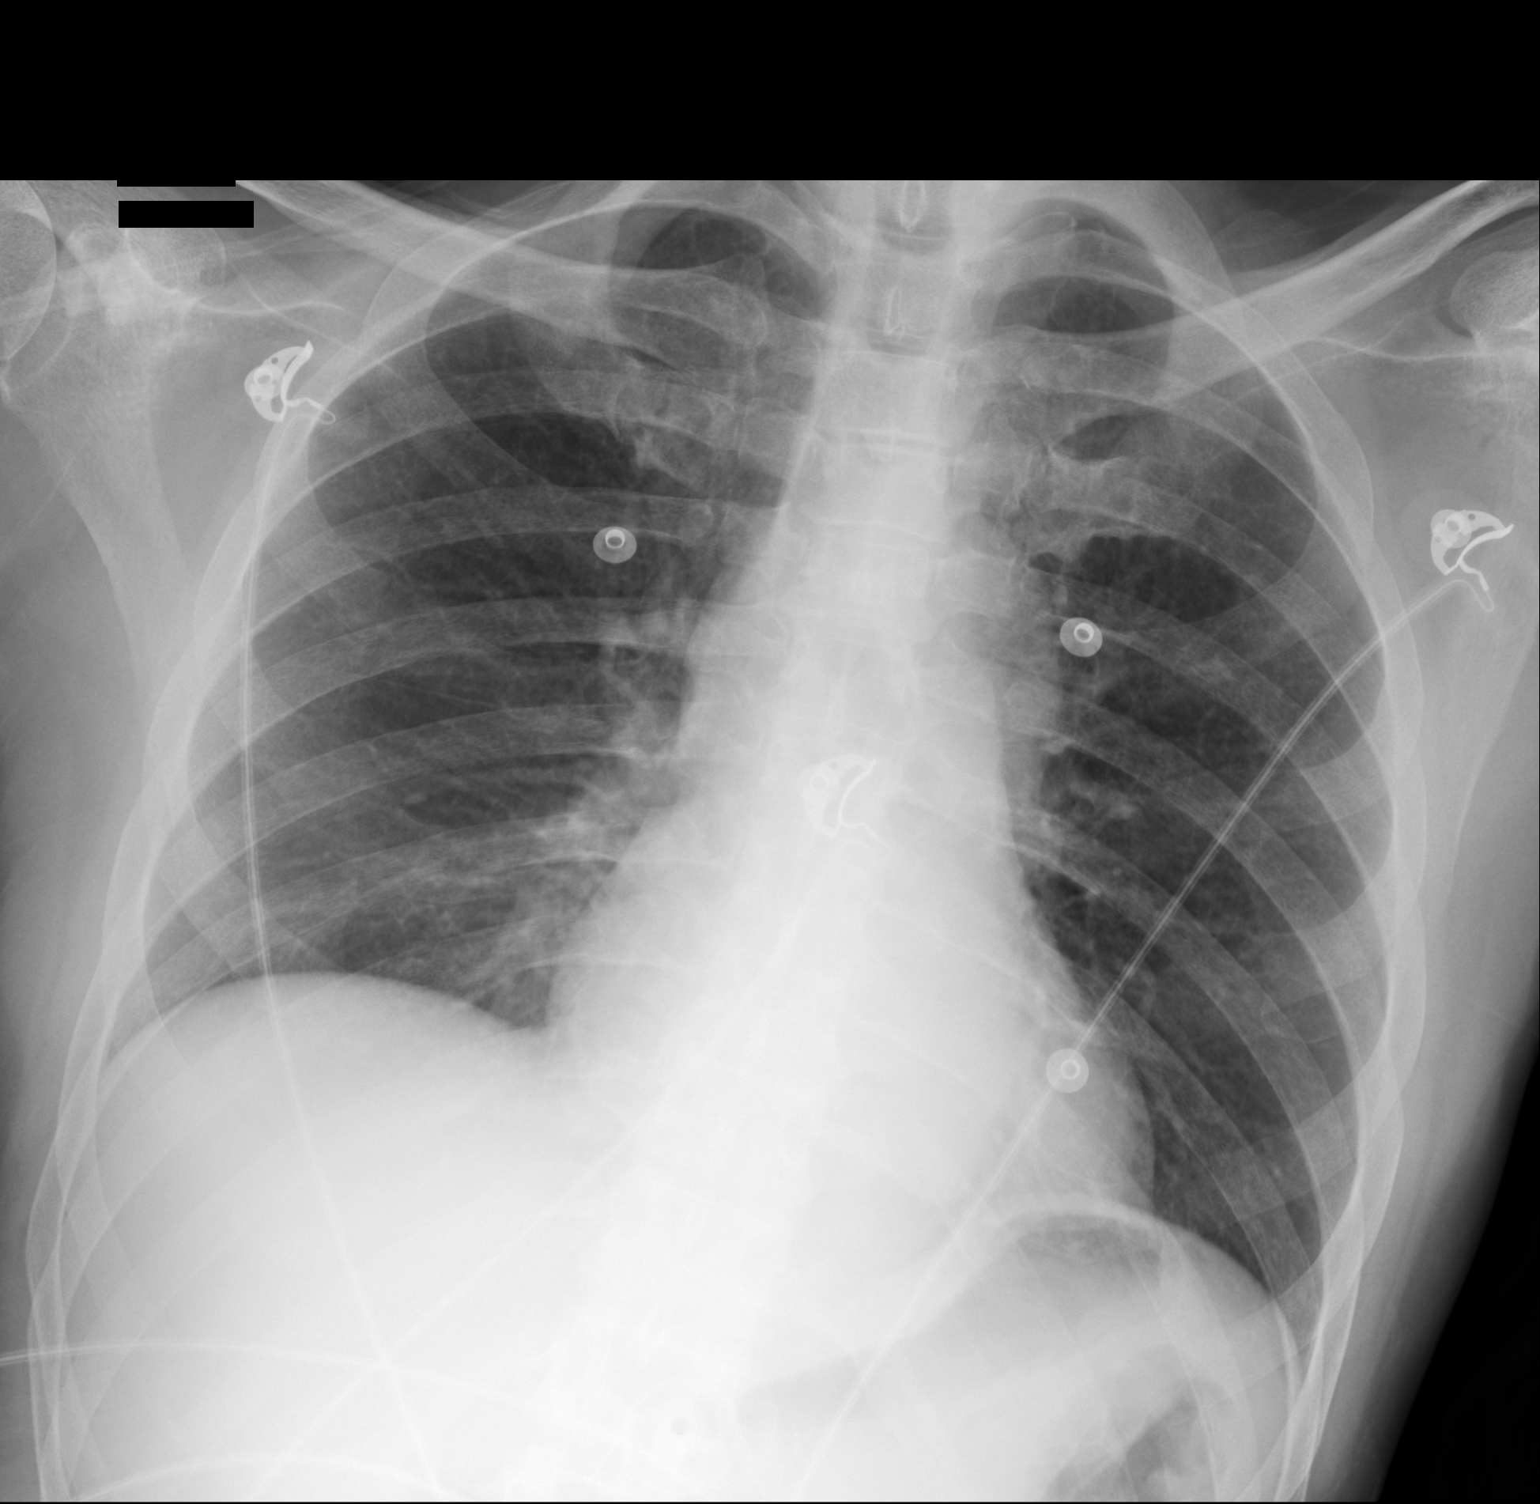

[1 of 1 positions shown; findings below may reference images not displayed]

FINDINGS: Cardiomediastinal silhouette is unremarkable for this low
inspiratory portable examination with crowded vasculature markings.
The lungs are clear without pleural effusions or focal
consolidations. Trachea projects midline and there is no
pneumothorax. Included soft tissue planes and osseous structures are
non-suspicious. Gastrostomy tube. Multiple EKG lines overlie the
patient and may obscure subtle underlying pathology.
IMPRESSION: No acute cardiopulmonary process.

  By: Nasreen Gremillion
